# Patient Record
Sex: Female | Born: 1960 | Race: White | Hispanic: No | Marital: Married | Smoking: Never smoker
Health system: Southern US, Community
[De-identification: ages and names within clinical notes are randomized; demographics above are authoritative.]

## PROBLEM LIST (undated history)

## (undated) DIAGNOSIS — E785 Hyperlipidemia, unspecified: Secondary | ICD-10-CM

## (undated) DIAGNOSIS — I10 Essential (primary) hypertension: Secondary | ICD-10-CM

## (undated) HISTORY — DX: Essential (primary) hypertension: I10

## (undated) HISTORY — DX: Hyperlipidemia, unspecified: E78.5

## (undated) HISTORY — PX: OOPHORECTOMY: SHX86

---

## 2004-02-25 ENCOUNTER — Ambulatory Visit: Admission: RE | Admit: 2004-02-25 | Discharge: 2004-02-25 | Payer: Self-pay | Admitting: Obstetrics and Gynecology

## 2004-02-25 ENCOUNTER — Encounter (INDEPENDENT_AMBULATORY_CARE_PROVIDER_SITE_OTHER): Payer: Self-pay | Admitting: Specialist

## 2004-02-25 ENCOUNTER — Encounter (INDEPENDENT_AMBULATORY_CARE_PROVIDER_SITE_OTHER): Payer: Self-pay | Admitting: *Deleted

## 2005-12-09 ENCOUNTER — Ambulatory Visit: Payer: Self-pay | Admitting: Family Medicine

## 2006-03-14 ENCOUNTER — Ambulatory Visit: Payer: Self-pay | Admitting: Gastroenterology

## 2006-03-14 LAB — CONVERTED CEMR LAB
ALT: 17 units/L (ref 0–40)
AST: 18 units/L (ref 0–37)
Albumin: 4.2 g/dL (ref 3.5–5.2)
Alkaline Phosphatase: 65 units/L (ref 39–117)
BUN: 8 mg/dL (ref 6–23)
Basophils Absolute: 0 10*3/uL (ref 0.0–0.1)
Basophils Relative: 0 % (ref 0.0–1.0)
CO2: 30 meq/L (ref 19–32)
Calcium: 9.4 mg/dL (ref 8.4–10.5)
Chloride: 99 meq/L (ref 96–112)
Creatinine, Ser: 0.9 mg/dL (ref 0.4–1.2)
Eosinophil percent: 1 % (ref 0.0–5.0)
GFR calc non Af Amer: 71 mL/min
Glomerular Filtration Rate, Af Am: 86 mL/min/{1.73_m2}
Glucose, Bld: 91 mg/dL (ref 70–99)
HCT: 44.3 % (ref 36.0–46.0)
Hemoglobin: 14.7 g/dL (ref 12.0–15.0)
Lymphocytes Relative: 14.3 % (ref 12.0–46.0)
MCHC: 33.3 g/dL (ref 30.0–36.0)
MCV: 88.5 fL (ref 78.0–100.0)
Monocytes Absolute: 1.1 10*3/uL — ABNORMAL HIGH (ref 0.2–0.7)
Monocytes Relative: 9.9 % (ref 3.0–11.0)
Neutro Abs: 8.2 10*3/uL — ABNORMAL HIGH (ref 1.4–7.7)
Neutrophils Relative %: 74.8 % (ref 43.0–77.0)
Platelets: 251 10*3/uL (ref 150–400)
Potassium: 3.5 meq/L (ref 3.5–5.1)
RBC: 5.01 M/uL (ref 3.87–5.11)
RDW: 12.5 % (ref 11.5–14.6)
Sed Rate: 11 mm/hr (ref 0–25)
Sodium: 138 meq/L (ref 135–145)
TSH: 1.17 microintl units/mL (ref 0.35–5.50)
Total Bilirubin: 1 mg/dL (ref 0.3–1.2)
Total Protein: 7.4 g/dL (ref 6.0–8.3)
WBC: 11 10*3/uL — ABNORMAL HIGH (ref 4.5–10.5)

## 2006-03-15 ENCOUNTER — Encounter (INDEPENDENT_AMBULATORY_CARE_PROVIDER_SITE_OTHER): Payer: Self-pay | Admitting: *Deleted

## 2006-03-15 ENCOUNTER — Ambulatory Visit: Payer: Self-pay | Admitting: Gastroenterology

## 2006-04-18 ENCOUNTER — Ambulatory Visit: Payer: Self-pay | Admitting: Gastroenterology

## 2010-05-20 ENCOUNTER — Other Ambulatory Visit: Payer: Self-pay | Admitting: Dermatology

## 2010-07-20 ENCOUNTER — Other Ambulatory Visit: Payer: Self-pay | Admitting: Dermatology

## 2010-08-27 NOTE — Op Note (Signed)
Michelle Walter, Michelle Walter               ACCOUNT NO.:  [id number]   MEDICAL RECORD NO.:  [id number]          PATIENT TYPE:  INP   LOCATION:  0009                         FACILITY:  [facility]   PHYSICIAN:  Katherine Roan, M.D.  DATE OF BIRTH:  [birth year]   DATE OF PROCEDURE:  02/25/2004  DATE OF DISCHARGE:                                 OPERATIVE REPORT   PREOPERATIVE DIAGNOSIS:  Left adnexal mass.   POSTOPERATIVE DIAGNOSIS:  Endometriosis.   OPERATION PERFORMED:  Pelvic examination under anesthesia, laparoscopy with  operative laparoscopy and removal of left tube and ovary, fulguration of  endometriosis with YAG laser.   The patient was placed in the lithotomy position, examined under anesthesia,  and found to have a left adnexal mass, which was about 5 cm, as previously  described.  The right ovary was normal.  She was then prepped and draped in  the usual fashion.  Foley catheter was inserted.  A transverse umbilical  incision was made in the abdomen, and the Veress needle was inserted into  the abdomen.  Aspiration and infusion was used to be sure it was properly  placed, and then a bladed trocar was placed into the abdomen.  This was an  11 mm.  An 11 mm trocar was then placed into the midline along with a 5 mm  on the left.  The infundibulopelvic ligament was placed on stretch and was  cauterized using a Seitzinger tripolar forceps.  Hemostasis was good.  The  ovary and tube were removed in a segmental fashion using small bites in the  mesosalpinx and the utero-ovarian anastomosis and tube.  We then visualized  the cul de sac to contain numerous areas of endometriosis, which we  cauterized with the YAG laser.  I then put the cyst in an endo bag and  retrieved it through a midline incision, which was expanded.  This was then  closed with interrupted sutures of 0 Vicryl.  The skin was closed with 4-0  PDS subcuticular interrupted suture and an umbilical incision was closed  with a  similar suture, and the 5 mm trocar site on the left was then closed  with interrupted sutures of 4-0 PDS.  The incisions were infiltrated with  0.5% Marcaine with epinephrine.  The Foley was draining clear urine.  She  tolerated the procedure well and was sent to the recovery room in good  condition.      SDM/MEDQ  D:  02/25/2004  T:  02/25/2004  Job:  [id number]

## 2010-08-27 NOTE — Assessment & Plan Note (Signed)
[facility]                         GASTROENTEROLOGY OFFICE NOTE   AUDIANNA, Michelle Walter                      MRN:          [id number]  DATE:03/14/2006                            DOB:          05/24/1960    NEW PATIENT EVALUATION:   PRIMARY PHYSICIAN:  Marne A. Tower, MD   PROBLEM:  Lower abdominal discomfort and diarrhea.   HISTORY:  Michelle Walter is a pleasant, generally healthy 50 year old white  female.  She does have a history of hypertension and hyperlipidemia.  She is status post C-section x2 and had an oophorectomy unilaterally in  November 2005.   The patient has not had any previous GI problems and now relates that  she has been having a change in her bowel habits and abdominal cramping  since September 2007.  She was originally seen by Dr. Milinda Antis shortly  thereafter and had stool cultures done, which were negative.  She says,  however, that her symptoms have persisted and that she is having at  least 3-4 loose stools per day.  She said at one point she was having so  much diarrhea that the stool was actually clear.  She has not noted any  melena or hematochezia, has not been having any nocturnal diarrhea.  She  describes lower abdominal discomfort, urgency and cramping.  She has not  had any associated fever or chills, no myalgias or arthralgias.  Her  appetite has been okay.  Her weight has been stable and overall her  energy level has been good.  She denies any increased stress, has not  been on any antibiotics that she can recall, at least over the past 6  months.  No new medications, supplements, etc.  She does not use any  artificial sweeteners and has not changed her diet.   CURRENT MEDICATIONS:  1. Crestor one p.o. daily, 25 mg, she believes.  2. Hydrochlorothiazide 25 mg daily.   ALLERGIES:  No known drug allergies.   PAST HISTORY:  Hypertension, hyperlipidemia, C-section x2, and  unilateral oophorectomy in November 2005.   FAMILY HISTORY:  Negative for colon cancer, polyps or inflammatory bowel  disease.   SOCIAL HISTORY:  The patient is married, has 2 children.  She is  employed as a Midwife.  She is a nonsmoker, uses alcohol  socially.   REVIEW OF SYSTEMS:  Reviewed in its entirety, pertinent for GI as above,  and dysmenorrhea and back pain with her menstrual period.   PHYSICAL EXAMINATION:  GENERAL:  A well-developed white female in no  acute distress.  VITAL SIGNS:  Height is 5 feet 3 inches, weight is 158.  Blood pressure  is 122/82, pulse in the 80s.  HEENT:  Atraumatic, normocephalic.  EOMI.  PERRLA.  Sclerae are  anicteric.  CARDIOVASCULAR:  Regular rate and rhythm with S1 and S2, no murmur, rub,  or gallop.  PULMONARY:  Clear to A&P.  ABDOMEN:  Soft.  She is mildly tender in the left lower quadrant and  left midquadrant.  There is no guarding or rebound, no mass or palpable  hepatosplenomegaly.  Bowel sounds are active.  RECTAL:  Hemoccult-negative and without mass.   IMPRESSION:  A 50 year old female with a 3-1/2 month history of  persistent diarrhea and lower abdominal pain and cramping.  Rule out new  onset of colitis/inflammatory bowel disease.  Rule out possible  irritable bowel syndrome.  Rule out infectious, though less likely.   PLAN:  1. Check a CBC with differential, CMET, sedimentation rate, sprue      panel and TSH.  2. Schedule colonoscopy.  3. Trial of Robinul Forte 2 mg p.o. b.i.d. in the interim.      Mike Gip, PA-C  Electronically Signed      Michelle Rea. Jarold Motto, MD, Caleen Essex, FAGA  Electronically Signed   AE/MedQ  DD: 03/14/2006  DT: 03/15/2006  Job #: [id number]   cc:   Marne A. Milinda Antis, MD

## 2013-10-14 ENCOUNTER — Telehealth: Payer: Self-pay | Admitting: *Deleted

## 2013-10-14 NOTE — Telephone Encounter (Signed)
Just called and made an appointment on the 21st to see Dr. Ralene CorkSikora.  I just want to make sure I don't have a life threatening ailment.  The ball of my foot is numb, not numb where I can't feel anything.  I just want to make sure it's not a sign of Diabetes.  I returned her call and informed her she should be okay waiting until the 21st to see Dr. Ralene CorkSikora because it's a common problem.  She stated okay, I just want to make sure.

## 2013-10-29 ENCOUNTER — Ambulatory Visit: Payer: Self-pay

## 2013-11-12 ENCOUNTER — Ambulatory Visit (INDEPENDENT_AMBULATORY_CARE_PROVIDER_SITE_OTHER): Payer: BC Managed Care – PPO

## 2013-11-12 VITALS — BP 136/77 | HR 71 | Resp 14 | Ht 63.0 in | Wt 155.0 lb

## 2013-11-12 DIAGNOSIS — G5761 Lesion of plantar nerve, right lower limb: Secondary | ICD-10-CM

## 2013-11-12 DIAGNOSIS — R200 Anesthesia of skin: Secondary | ICD-10-CM

## 2013-11-12 DIAGNOSIS — M722 Plantar fascial fibromatosis: Secondary | ICD-10-CM

## 2013-11-12 DIAGNOSIS — R209 Unspecified disturbances of skin sensation: Secondary | ICD-10-CM

## 2013-11-12 DIAGNOSIS — G5760 Lesion of plantar nerve, unspecified lower limb: Secondary | ICD-10-CM

## 2013-11-12 NOTE — Patient Instructions (Signed)
Morton's Neuroma in Sports  (Interdigital Plantar Neuroma) Morton's neuroma is a condition of the nervous system that results in pain or loss of feeling in the toes. The disease is caused by the bones of the foot squeezing the nerve that runs between two toes (interdigital nerve). The third and fourth toes are most likely to be affected by this disease. SYMPTOMS   Tingling, numbness, burning, or electric shocks in the front of the foot, often involving the third and fourth toes, although it may involve any other pair of toes.  Pain and tenderness in the front of the foot, that gets worse when walking.  Pain that gets worse when pressure is applied to the foot (wearing shoes).  Severe pain in the front of the foot, when standing on the front of the foot (on tiptoes), such as with running, jumping, pivoting, or dancing. CAUSES  Morton's neuroma is caused by swelling of the nerve between two toes. This swelling causes the nerve to be pinched between the bones of the foot. RISK INCREASES WITH:  Recurring foot or ankle injuries.  Poor fitting or worn shoes, with minimal padding and shock absorbers.  Loose ligaments of the foot, causing thickening of the nerve.  Poor foot strength and flexibility. PREVENTION  Warm up and stretch properly before activity.  Maintain physical fitness:  Foot and ankle flexibility.  Muscle strength and endurance.  Cardiovascular fitness.  Wear properly fitted and padded shoes.  Wear arch supports (orthotics), when needed. PROGNOSIS  If treated properly, Morton's neuroma can usually be cured with non-surgical treatment. For certain cases, surgery may be needed. RELATED COMPLICATIONS  Permanent numbness and pain in the foot.  Inability to participate in athletics, because of pain. TREATMENT Treatment first involves stopping any activities that make the symptoms worse. The use of ice and medicine will help reduce pain and inflammation. Wearing shoes  with a wide toe box, and an orthotic arch support or metatarsal bar, may also reduce pain. Your caregiver may give you a corticosteroid injection, to further reduce inflammation. If non-surgical treatment is unsuccessful, surgery may be needed. Surgery to fix Morton's neuroma is often performed as an outpatient procedure, meaning you can go home the same day as the surgery. The procedure involves removing the source of pressure on the nerve. If it is necessary to remove the nerve, you can expect persistent numbness. MEDICATION  If pain medicine is needed, nonsteroidal anti-inflammatory medicines (aspirin and ibuprofen), or other minor pain relievers (acetaminophen), are often advised.  Do not take pain medicine for 7 days before surgery.  Prescription pain relievers are usually prescribed only after surgery. Use only as directed and only as much as you need.  Corticosteroid injections are used in extreme cases, to reduce inflammation. These injections should be done only if necessary, because they may be given only a limited number of times. HEAT AND COLD  Cold treatment (icing) should be applied for 10 to 15 minutes every 2 to 3 hours for inflammation and pain, and immediately after activity that aggravates your symptoms. Use ice packs or an ice massage.  Heat treatment may be used before performing stretching and strengthening activities prescribed by your caregiver, physical therapist, or athletic trainer. Use a heat pack or a warm water soak. SEEK MEDICAL CARE IF:   Symptoms get worse or do not improve in 2 weeks, despite treatment.  After surgery you develop increasing pain, swelling, redness, increased warmth, bleeding, drainage of fluids, or fever.  New, unexplained symptoms develop. (  Drugs used in treatment may produce side effects.) Document Released: 02/02/2005 Document Revised: 06/20/2011 Document Reviewed: 07/10/2008 [facility] Patient Information 2015 [employer]. This  information is not intended to replace advice given to you by your health care provider. Make sure you discuss any questions you have with your health care provider.   Recommendations for a pinched nerve to try to help restore some sensation of feeling is a B complex and folic acid, he should be available and a general multivitamin

## 2013-11-12 NOTE — Progress Notes (Signed)
   Subjective:    Patient ID: Michelle Walter, female    DOB: [birth year], 53 y.o.   MRN: [id number]  HPI Comments: N neuroma L right plantar forefoot and 2nd toe D Spring of 2015 O  C numbness A unknown stimuli T none      Review of Systems  Musculoskeletal: Positive for gait problem.       Stiffness in walking after sitting.  All other systems reviewed and are negative.      Objective:   Physical Exam 53 year old white female well-developed well-nourished oriented x3 presents this time with general numbness or abnormal sensation of her second toe right foot. Left foot is unremarkable is been going on for more than a year or so occasionally a tingle but no significant pain or other abnormality is a family history of diabetes although has not been diagnosed yourself. Currently ambulating in some flip-flops also points out that she has a small nodule medial band of the plantar fascia on her right foot. Next progress partially objective findings reveal neurovascular status is intact pedal pulses are palpable DP and PT +2/4 bilateral Refill time 3 seconds epicritic and proprioceptive sensations appear to be intact all areas although patient describes loss of feeling in her second toe and occasionally gets a tingling sensation or abnormal touch. Not completely numb but reduced sensation. 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 The bunions have been asymptomatic thus far.       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 Patient advised to avoid the  flip-flops when it and compressive on her foot did recommend vitamin B6 B12 and folic acid supplementation or a multivitamin. Wide accommodative shoes such as Physicist, medicalellegrino or [employer] her dance toe shoes no barefoot no flimsy shoes or flip-flops. If symptoms persist or worsen may be candidate for more invasive options also possibly surgical intervention for bunions if they become symptomatic in the future.  Alvan Dameichard Chrisanne Loose DPM

## 2014-04-29 ENCOUNTER — Other Ambulatory Visit: Payer: Self-pay | Admitting: Family Medicine

## 2014-04-29 ENCOUNTER — Ambulatory Visit
Admission: RE | Admit: 2014-04-29 | Discharge: 2014-04-29 | Disposition: A | Discharge status: Home / Self Care | Payer: BC Managed Care – PPO | Source: Ambulatory Visit | Attending: Family Medicine | Admitting: Family Medicine

## 2014-04-29 DIAGNOSIS — R05 Cough: Secondary | ICD-10-CM

## 2014-04-29 DIAGNOSIS — R059 Cough, unspecified: Secondary | ICD-10-CM

## 2014-08-06 ENCOUNTER — Other Ambulatory Visit: Payer: Self-pay | Admitting: Dermatology

## 2015-01-19 ENCOUNTER — Other Ambulatory Visit: Payer: Self-pay

## 2017-05-04 ENCOUNTER — Other Ambulatory Visit: Payer: Self-pay | Admitting: Obstetrics & Gynecology

## 2017-05-04 ENCOUNTER — Telehealth: Payer: Self-pay | Admitting: *Deleted

## 2017-05-04 DIAGNOSIS — Z1231 Encounter for screening mammogram for malignant neoplasm of breast: Secondary | ICD-10-CM

## 2017-05-04 NOTE — Telephone Encounter (Signed)
[facility] asked name [facility] facility, breast center # given to patient.

## 2017-05-24 ENCOUNTER — Ambulatory Visit
Admission: RE | Admit: 2017-05-24 | Discharge: 2017-05-24 | Disposition: A | Discharge status: Home / Self Care | Payer: BC Managed Care – PPO | Source: Ambulatory Visit | Attending: Obstetrics & Gynecology | Admitting: Obstetrics & Gynecology

## 2017-05-24 ENCOUNTER — Encounter: Payer: Self-pay | Admitting: Radiology

## 2017-05-24 DIAGNOSIS — Z1231 Encounter for screening mammogram for malignant neoplasm of breast: Secondary | ICD-10-CM

## 2017-05-30 ENCOUNTER — Encounter: Payer: BC Managed Care – PPO | Admitting: Obstetrics & Gynecology

## 2017-06-08 ENCOUNTER — Encounter: Payer: Self-pay | Admitting: Obstetrics & Gynecology

## 2017-06-08 ENCOUNTER — Ambulatory Visit: Payer: BC Managed Care – PPO | Admitting: Obstetrics & Gynecology

## 2017-06-08 VITALS — BP 140/90 | Ht 62.75 in | Wt 142.0 lb

## 2017-06-08 DIAGNOSIS — Z780 Asymptomatic menopausal state: Secondary | ICD-10-CM

## 2017-06-08 DIAGNOSIS — Z01419 Encounter for gynecological examination (general) (routine) without abnormal findings: Secondary | ICD-10-CM | POA: Diagnosis not present

## 2017-06-08 DIAGNOSIS — Z13820 Encounter for screening for osteoporosis: Secondary | ICD-10-CM

## 2017-06-08 NOTE — Patient Instructions (Signed)
1. Well female exam with routine gynecological exam Normal gynecologic exam status post LSO.  Pap negative February 2018.  Will repeat Pap test next year.  Breast exam normal.  Mammogram negative February 2019.  Will organize next colonoscopy with Dr. Collene Mares.  Health labs with family physician.  Recommend regular aerobic and weightlifting physical activity.  2. Menopause present Well on no hormone replacement therapy.  No postmenopausal bleeding.  3. Screening for osteoporosis Vitamin D supplements, calcium rich nutrition and regular weightbearing physical activity recommended.  Will schedule bone density here now. - DG Bone Density; Future  Michelle Walter, it was a pleasure seeing you today!   Health Maintenance for Postmenopausal Women Menopause is a normal process in which your reproductive ability comes to an end. This process happens gradually over a span of months to years, usually between the ages of 62 and 33. Menopause is complete when you have missed 12 consecutive menstrual periods. It is important to talk with your health care provider about some of the most common conditions that affect postmenopausal women, such as heart disease, cancer, and bone loss (osteoporosis). Adopting a healthy lifestyle and getting preventive care can help to promote your health and wellness. Those actions can also lower your chances of developing some of these common conditions. What should I know about menopause? During menopause, you may experience a number of symptoms, such as:  Moderate-to-severe hot flashes.  Night sweats.  Decrease in sex drive.  Mood swings.  Headaches.  Tiredness.  Irritability.  Memory problems.  Insomnia.  Choosing to treat or not to treat menopausal changes is an individual decision that you make with your health care provider. What should I know about hormone replacement therapy and supplements? Hormone therapy products are effective for treating symptoms that are  associated with menopause, such as hot flashes and night sweats. Hormone replacement carries certain risks, especially as you become older. If you are thinking about using estrogen or estrogen with progestin treatments, discuss the benefits and risks with your health care provider. What should I know about heart disease and stroke? Heart disease, heart attack, and stroke become more likely as you age. This may be due, in part, to the hormonal changes that your body experiences during menopause. These can affect how your body processes dietary fats, triglycerides, and cholesterol. Heart attack and stroke are both medical emergencies. There are many things that you can do to help prevent heart disease and stroke:  Have your blood pressure checked at least every 1-2 years. High blood pressure causes heart disease and increases the risk of stroke.  If you are 36-12 years old, ask your health care provider if you should take aspirin to prevent a heart attack or a stroke.  Do not use any tobacco products, including cigarettes, chewing tobacco, or electronic cigarettes. If you need help quitting, ask your health care provider.  It is important to eat a healthy diet and maintain a healthy weight. ? Be sure to include plenty of vegetables, fruits, low-fat dairy products, and lean protein. ? Avoid eating foods that are high in solid fats, added sugars, or salt (sodium).  Get regular exercise. This is one of the most important things that you can do for your health. ? Try to exercise for at least 150 minutes each week. The type of exercise that you do should increase your heart rate and make you sweat. This is known as moderate-intensity exercise. ? Try to do strengthening exercises at least twice each week. Do  these in addition to the moderate-intensity exercise.  Know your numbers.Ask your health care provider to check your cholesterol and your blood glucose. Continue to have your blood tested as directed  by your health care provider.  What should I know about cancer screening? There are several types of cancer. Take the following steps to reduce your risk and to catch any cancer development as early as possible. Breast Cancer  Practice breast self-awareness. ? This means understanding how your breasts normally appear and feel. ? It also means doing regular breast self-exams. Let your health care provider know about any changes, no matter how small.  If you are 64 or older, have a clinician do a breast exam (clinical breast exam or CBE) every year. Depending on your age, family history, and medical history, it may be recommended that you also have a yearly breast X-ray (mammogram).  If you have a family history of breast cancer, talk with your health care provider about genetic screening.  If you are at high risk for breast cancer, talk with your health care provider about having an MRI and a mammogram every year.  Breast cancer (BRCA) gene test is recommended for women who have family members with BRCA-related cancers. Results of the assessment will determine the need for genetic counseling and BRCA1 and for BRCA2 testing. BRCA-related cancers include these types: ? Breast. This occurs in males or females. ? Ovarian. ? Tubal. This may also be called fallopian tube cancer. ? Cancer of the abdominal or pelvic lining (peritoneal cancer). ? Prostate. ? Pancreatic.  Cervical, Uterine, and Ovarian Cancer Your health care provider may recommend that you be screened regularly for cancer of the pelvic organs. These include your ovaries, uterus, and vagina. This screening involves a pelvic exam, which includes checking for microscopic changes to the surface of your cervix (Pap test).  For women ages 21-65, health care providers may recommend a pelvic exam and a Pap test every three years. For women ages 74-65, they may recommend the Pap test and pelvic exam, combined with testing for human papilloma  virus (HPV), every five years. Some types of HPV increase your risk of cervical cancer. Testing for HPV may also be done on women of any age who have unclear Pap test results.  Other health care providers may not recommend any screening for nonpregnant women who are considered low risk for pelvic cancer and have no symptoms. Ask your health care provider if a screening pelvic exam is right for you.  If you have had past treatment for cervical cancer or a condition that could lead to cancer, you need Pap tests and screening for cancer for at least 20 years after your treatment. If Pap tests have been discontinued for you, your risk factors (such as having a new sexual partner) need to be reassessed to determine if you should start having screenings again. Some women have medical problems that increase the chance of getting cervical cancer. In these cases, your health care provider may recommend that you have screening and Pap tests more often.  If you have a family history of uterine cancer or ovarian cancer, talk with your health care provider about genetic screening.  If you have vaginal bleeding after reaching menopause, tell your health care provider.  There are currently no reliable tests available to screen for ovarian cancer.  Lung Cancer Lung cancer screening is recommended for adults 33-87 years old who are at high risk for lung cancer because of a history of  smoking. A yearly low-dose CT scan of the lungs is recommended if you:  Currently smoke.  Have a history of at least 30 pack-years of smoking and you currently smoke or have quit within the past 15 years. A pack-year is smoking an average of one pack of cigarettes per day for one year.  Yearly screening should:  Continue until it has been 15 years since you quit.  Stop if you develop a health problem that would prevent you from having lung cancer treatment.  Colorectal Cancer  This type of cancer can be detected and can often  be prevented.  Routine colorectal cancer screening usually begins at age 57 and continues through age 87.  If you have risk factors for colon cancer, your health care provider may recommend that you be screened at an earlier age.  If you have a family history of colorectal cancer, talk with your health care provider about genetic screening.  Your health care provider may also recommend using home test kits to check for hidden blood in your stool.  A small camera at the end of a tube can be used to examine your colon directly (sigmoidoscopy or colonoscopy). This is done to check for the earliest forms of colorectal cancer.  Direct examination of the colon should be repeated every 5-10 years until age 52. However, if early forms of precancerous polyps or small growths are found or if you have a family history or genetic risk for colorectal cancer, you may need to be screened more often.  Skin Cancer  Check your skin from head to toe regularly.  Monitor any moles. Be sure to tell your health care provider: ? About any new moles or changes in moles, especially if there is a change in a mole's shape or color. ? If you have a mole that is larger than the size of a pencil eraser.  If any of your family members has a history of skin cancer, especially at a young age, talk with your health care provider about genetic screening.  Always use sunscreen. Apply sunscreen liberally and repeatedly throughout the day.  Whenever you are outside, protect yourself by wearing long sleeves, pants, a wide-brimmed hat, and sunglasses.  What should I know about osteoporosis? Osteoporosis is a condition in which bone destruction happens more quickly than new bone creation. After menopause, you may be at an increased risk for osteoporosis. To help prevent osteoporosis or the bone fractures that can happen because of osteoporosis, the following is recommended:  If you are 15-3 years old, get at least 1,000 mg of  calcium and at least 600 mg of vitamin D per day.  If you are older than age 30 but younger than age 28, get at least 1,200 mg of calcium and at least 600 mg of vitamin D per day.  If you are older than age 57, get at least 1,200 mg of calcium and at least 800 mg of vitamin D per day.  Smoking and excessive alcohol intake increase the risk of osteoporosis. Eat foods that are rich in calcium and vitamin D, and do weight-bearing exercises several times each week as directed by your health care provider. What should I know about how menopause affects my mental health? Depression may occur at any age, but it is more common as you become older. Common symptoms of depression include:  Low or sad mood.  Changes in sleep patterns.  Changes in appetite or eating patterns.  Feeling an overall lack of  motivation or enjoyment of activities that you previously enjoyed.  Frequent crying spells.  Talk with your health care provider if you think that you are experiencing depression. What should I know about immunizations? It is important that you get and maintain your immunizations. These include:  Tetanus, diphtheria, and pertussis (Tdap) booster vaccine.  Influenza every year before the flu season begins.  Pneumonia vaccine.  Shingles vaccine.  Your health care provider may also recommend other immunizations. This information is not intended to replace advice given to you by your health care provider. Make sure you discuss any questions you have with your health care provider. Document Released: 05/20/2005 Document Revised: 10/16/2015 Document Reviewed: 12/30/2014 Elsevier Interactive Patient Education  2018 [employer].

## 2017-06-08 NOTE — Progress Notes (Signed)
Michelle FinnerCarol T Walter [birth year] [id number]   History:    57 y.o. G2P2L2  Married.  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  59-year-old granddaughter.  RP:  Established patient presenting for annual gyn exam   HPI: Status post LSO.  Menopause.  Well on no hormone replacement therapy.  No postmenopausal bleeding.  No pelvic pain.  Uses [place] for intercourse.  No postcoital bleeding and no pain.  Urine and bowel movements normal.  Breasts normal.  Body mass index 25.36.  Health labs with family physician.  Past medical history,surgical history, family history and social history were all reviewed and documented in the EPIC chart.  Gynecologic History Patient's last menstrual period was 09/22/2013. Contraception: post menopausal status Last Pap: 05/2016. Results were: Negative Last mammogram: 05/2017. Results were: Negative Bone Density: Never Colonoscopy: 2013 with Dr Loreta AveMann  Obstetric History OB History  Gravida Para Term Preterm AB Living  2 2       2   SAB TAB Ectopic Multiple Live Births               # Outcome Date GA Lbr Len/2nd Weight Sex Delivery Anes PTL Lv  2 Para           1 Para                ROS: A ROS was performed and pertinent positives and negatives are included in the history.  GENERAL: No fevers or chills. HEENT: No change in vision, no earache, sore throat or sinus congestion. NECK: No pain or stiffness. CARDIOVASCULAR: No chest pain or pressure. No palpitations. PULMONARY: No shortness of breath, cough or wheeze. GASTROINTESTINAL: No abdominal pain, nausea, vomiting or diarrhea, melena or bright red blood per rectum. GENITOURINARY: No urinary frequency, urgency, hesitancy or dysuria. MUSCULOSKELETAL: No joint or muscle pain, no back pain, no recent trauma. DERMATOLOGIC: No rash, no itching, no lesions. ENDOCRINE: No polyuria, polydipsia, no heat or cold intolerance. No recent change in weight. HEMATOLOGICAL: No anemia or easy bruising or bleeding. NEUROLOGIC: No headache, seizures,  numbness, tingling or weakness. PSYCHIATRIC: No depression, no loss of interest in normal activity or change in sleep pattern.     Exam:   BP 140/90   Ht 5' 2.75" (1.594 m)   Wt 142 lb (64.4 kg)   LMP 09/22/2013   BMI 25.36 kg/m   Body mass index is 25.36 kg/m.  General appearance : Well developed well nourished female. No acute distress HEENT: Eyes: no retinal hemorrhage or exudates,  Neck supple, trachea midline, no carotid bruits, no thyroidmegaly Lungs: Clear to auscultation, no rhonchi or wheezes, or rib retractions  Heart: Regular rate and rhythm, no murmurs or gallops Breast:Examined in sitting and supine position were symmetrical in appearance, no palpable masses or tenderness,  no skin retraction, no nipple inversion, no nipple discharge, no skin discoloration, no axillary or supraclavicular lymphadenopathy Abdomen: no palpable masses or tenderness, no rebound or guarding Extremities: no edema or skin discoloration or tenderness  Pelvic: Vulva: Normal             Vagina: No gross lesions or discharge  Cervix: No gross lesions or discharge  Uterus  AV, normal size, shape and consistency, non-tender and mobile  Adnexa  Without masses or tenderness  Anus: Normal   Assessment/Plan:  57 y.o. female for annual exam   1. Well female exam with routine gynecological exam Normal gynecologic exam status post LSO.  Pap negative February 2018.  Will repeat Pap test next year.  Breast exam normal.  Mammogram negative February 2019.  Will organize next colonoscopy with Dr. Loreta Ave.  Health labs with family physician.  Recommend regular aerobic and weightlifting physical activity.  2. Menopause present Well on no hormone replacement therapy.  No postmenopausal bleeding.  3. Screening for osteoporosis Vitamin D supplements, calcium rich nutrition and regular weightbearing physical activity recommended.  Will schedule bone density here now. - DG Bone Density; Future  Genia Del  MD, 9:54 AM 06/08/2017

## 2017-06-14 ENCOUNTER — Other Ambulatory Visit: Payer: Self-pay | Admitting: Gynecology

## 2017-06-14 DIAGNOSIS — Z13820 Encounter for screening for osteoporosis: Secondary | ICD-10-CM

## 2017-06-16 ENCOUNTER — Other Ambulatory Visit: Payer: Self-pay | Admitting: Family Medicine

## 2017-06-16 DIAGNOSIS — R0989 Other specified symptoms and signs involving the circulatory and respiratory systems: Secondary | ICD-10-CM

## 2017-06-20 ENCOUNTER — Ambulatory Visit
Admission: RE | Admit: 2017-06-20 | Discharge: 2017-06-20 | Disposition: A | Discharge status: Home / Self Care | Payer: BC Managed Care – PPO | Source: Ambulatory Visit | Attending: Family Medicine | Admitting: Family Medicine

## 2017-06-20 DIAGNOSIS — R0989 Other specified symptoms and signs involving the circulatory and respiratory systems: Secondary | ICD-10-CM

## 2017-06-22 ENCOUNTER — Encounter: Payer: Self-pay | Admitting: Gynecology

## 2017-06-22 ENCOUNTER — Ambulatory Visit (INDEPENDENT_AMBULATORY_CARE_PROVIDER_SITE_OTHER): Payer: BC Managed Care – PPO

## 2017-06-22 DIAGNOSIS — Z13820 Encounter for screening for osteoporosis: Secondary | ICD-10-CM

## 2017-06-27 ENCOUNTER — Other Ambulatory Visit: Payer: Self-pay | Admitting: Family Medicine

## 2017-06-27 DIAGNOSIS — E041 Nontoxic single thyroid nodule: Secondary | ICD-10-CM

## 2017-06-30 ENCOUNTER — Ambulatory Visit
Admission: RE | Admit: 2017-06-30 | Discharge: 2017-06-30 | Disposition: A | Discharge status: Home / Self Care | Payer: BC Managed Care – PPO | Source: Ambulatory Visit | Attending: Family Medicine | Admitting: Family Medicine

## 2017-06-30 DIAGNOSIS — E041 Nontoxic single thyroid nodule: Secondary | ICD-10-CM

## 2017-07-07 ENCOUNTER — Other Ambulatory Visit: Payer: Self-pay | Admitting: Family Medicine

## 2017-07-07 DIAGNOSIS — E041 Nontoxic single thyroid nodule: Secondary | ICD-10-CM

## 2017-08-02 ENCOUNTER — Other Ambulatory Visit (HOSPITAL_COMMUNITY)
Admission: RE | Admit: 2017-08-02 | Discharge: 2017-08-02 | Disposition: A | Discharge status: Home / Self Care | Payer: BC Managed Care – PPO | Source: Ambulatory Visit | Attending: Physician Assistant | Admitting: Physician Assistant

## 2017-08-02 ENCOUNTER — Ambulatory Visit
Admission: RE | Admit: 2017-08-02 | Discharge: 2017-08-02 | Disposition: A | Discharge status: Home / Self Care | Payer: BC Managed Care – PPO | Source: Ambulatory Visit | Attending: Family Medicine | Admitting: Family Medicine

## 2017-08-02 DIAGNOSIS — E041 Nontoxic single thyroid nodule: Secondary | ICD-10-CM | POA: Diagnosis present

## 2018-04-26 ENCOUNTER — Other Ambulatory Visit: Payer: Self-pay | Admitting: Obstetrics & Gynecology

## 2018-04-26 DIAGNOSIS — Z1231 Encounter for screening mammogram for malignant neoplasm of breast: Secondary | ICD-10-CM

## 2018-05-31 ENCOUNTER — Ambulatory Visit
Admission: RE | Admit: 2018-05-31 | Discharge: 2018-05-31 | Disposition: A | Discharge status: Home / Self Care | Payer: BC Managed Care – PPO | Source: Ambulatory Visit | Attending: Obstetrics & Gynecology | Admitting: Obstetrics & Gynecology

## 2018-05-31 DIAGNOSIS — Z1231 Encounter for screening mammogram for malignant neoplasm of breast: Secondary | ICD-10-CM

## 2018-06-14 ENCOUNTER — Encounter: Payer: BC Managed Care – PPO | Admitting: Obstetrics & Gynecology

## 2018-08-13 ENCOUNTER — Other Ambulatory Visit: Payer: Self-pay

## 2018-08-14 ENCOUNTER — Ambulatory Visit: Payer: BC Managed Care – PPO | Admitting: Obstetrics & Gynecology

## 2018-08-14 ENCOUNTER — Encounter: Payer: Self-pay | Admitting: Obstetrics & Gynecology

## 2018-08-14 VITALS — BP 136/90 | Ht 61.5 in | Wt 154.4 lb

## 2018-08-14 DIAGNOSIS — Z01419 Encounter for gynecological examination (general) (routine) without abnormal findings: Secondary | ICD-10-CM | POA: Diagnosis not present

## 2018-08-14 DIAGNOSIS — Z780 Asymptomatic menopausal state: Secondary | ICD-10-CM | POA: Diagnosis not present

## 2018-08-14 DIAGNOSIS — E663 Overweight: Secondary | ICD-10-CM

## 2018-08-14 DIAGNOSIS — Z87410 Personal history of cervical dysplasia: Secondary | ICD-10-CM | POA: Diagnosis not present

## 2018-08-14 NOTE — Progress Notes (Signed)
Michelle Walter September 16, 1960 [id number]   History:    58 y.o. G2P2L2 Married  RP:  Established patient presenting for annual gyn exam   HPI: Menopause, well on no hormone replacement therapy.  No postmenopausal bleeding.  No pelvic pain.  No pain with intercourse.  Normal vaginal secretions.  Urine and bowel movements normal.  Breasts normal.  Body mass index 28.7.  Health labs with family physician.  Colonoscopy in 2019.  Past medical history,surgical history, family history and social history were all reviewed and documented in the EPIC chart.  Gynecologic History Patient's last menstrual period was 09/22/2013. Contraception: post menopausal status Last Pap: 2018. Results were: normal per patient Last mammogram: February 2020. Results were: Negative Bone Density: March 2019 normal, repeat at 5 years. Colonoscopy: 2019  Obstetric History OB History  Gravida Para Term Preterm AB Living  2 2       2   SAB TAB Ectopic Multiple Live Births               # Outcome Date GA Lbr Len/2nd Weight Sex Delivery Anes PTL Lv  2 Para           1 Para              ROS: A ROS was performed and pertinent positives and negatives are included in the history.  GENERAL: No fevers or chills. HEENT: No change in vision, no earache, sore throat or sinus congestion. NECK: No pain or stiffness. CARDIOVASCULAR: No chest pain or pressure. No palpitations. PULMONARY: No shortness of breath, cough or wheeze. GASTROINTESTINAL: No abdominal pain, nausea, vomiting or diarrhea, melena or bright red blood per rectum. GENITOURINARY: No urinary frequency, urgency, hesitancy or dysuria. MUSCULOSKELETAL: No joint or muscle pain, no back pain, no recent trauma. DERMATOLOGIC: No rash, no itching, no lesions. ENDOCRINE: No polyuria, polydipsia, no heat or cold intolerance. No recent change in weight. HEMATOLOGICAL: No anemia or easy bruising or bleeding. NEUROLOGIC: No headache, seizures, numbness, tingling or weakness.  PSYCHIATRIC: No depression, no loss of interest in normal activity or change in sleep pattern.     Exam:   BP 136/90   Ht 5' 1.5" (1.562 m)   Wt 154 lb 6.4 oz (70 kg)   LMP 09/22/2013   BMI 28.70 kg/m   Body mass index is 28.7 kg/m.  General appearance : Well developed well nourished female. No acute distress HEENT: Eyes: no retinal hemorrhage or exudates,  Neck supple, trachea midline, no carotid bruits, no thyroidmegaly Lungs: Clear to auscultation, no rhonchi or wheezes, or rib retractions  Heart: Regular rate and rhythm, no murmurs or gallops Breast:Examined in sitting and supine position were symmetrical in appearance, no palpable masses or tenderness,  no skin retraction, no nipple inversion, no nipple discharge, no skin discoloration, no axillary or supraclavicular lymphadenopathy Abdomen: no palpable masses or tenderness, no rebound or guarding Extremities: no edema or skin discoloration or tenderness  Pelvic: Vulva: Normal             Vagina: No gross lesions or discharge  Cervix: No gross lesions or discharge.  Pap reflex done.  Uterus AV, normal size, shape and consistency, non-tender and mobile  Adnexa  Without masses or tenderness  Anus: Normal   Assessment/Plan:  58 y.o. female for annual exam   1. Encounter for routine gynecological examination with Papanicolaou smear of cervix Normal gynecologic exam in menopause.  Pap reflex done.  Breast exam normal.  Last screening mammogram February 2020  was negative.  Colonoscopy done in 2019.  Health labs with family physician.  2. Postmenopause Well on no hormone replacement therapy.  No postmenopausal bleeding.  Bone density in March 2019 was completely normal, will repeat at 5 years.  Recommend vitamin D supplements, calcium intake of 1200 to 1500 mg daily and regular weightbearing physical activities.  3. Personal history of cervical dysplasia Pap reflex done today.  4. Overweight (BMI 25.0-29.9) Recommend a lower  calorie/carb diet such as [employer] diet and regular physical activities with aerobic activities 5 times a week and weightlifting every 2 days.  Other orders - polyethylene glycol (MIRALAX / GLYCOLAX) 17 g packet; Take 17 g by mouth daily. - losartan-hydrochlorothiazide (HYZAAR) 100-25 MG tablet; Take 1 tablet by mouth daily. - meloxicam (MOBIC) 7.5 MG tablet; Take 7.5 mg by mouth daily. - omeprazole (PRILOSEC) 20 MG capsule; Take 20 mg by mouth daily. - ALPRAZolam (XANAX) 0.25 MG tablet; Take 0.25 mg by mouth 2 (two) times daily as needed for anxiety.  Michelle DelMarie-Lyne Denetra Formoso MD, 2:09 PM 08/14/2018

## 2018-08-16 LAB — HUMAN PAPILLOMAVIRUS, HIGH RISK: HPV DNA High Risk: NOT DETECTED

## 2018-08-16 LAB — PAP IG W/ RFLX HPV ASCU

## 2018-08-17 ENCOUNTER — Encounter: Payer: BC Managed Care – PPO | Admitting: Obstetrics & Gynecology

## 2018-08-17 ENCOUNTER — Encounter: Payer: Self-pay | Admitting: Obstetrics & Gynecology

## 2018-08-17 NOTE — Patient Instructions (Signed)
1. Encounter for routine gynecological examination with Papanicolaou smear of cervix Normal gynecologic exam in menopause.  Pap reflex done.  Breast exam normal.  Last screening mammogram February 2020 was negative.  Colonoscopy done in 2019.  Health labs with family physician.  2. Postmenopause Well on no hormone replacement therapy.  No postmenopausal bleeding.  Bone density in March 2019 was completely normal, will repeat at 5 years.  Recommend vitamin D supplements, calcium intake of 1200 to 1500 mg daily and regular weightbearing physical activities.  3. Personal history of cervical dysplasia Pap reflex done today.  4. Overweight (BMI 25.0-29.9) Recommend a lower calorie/carb diet such as [employer] and regular physical activities with aerobic activities 5 times a week and weightlifting every 2 days.  Other orders - polyethylene glycol (MIRALAX / GLYCOLAX) 17 g packet; Take 17 g by mouth daily. - losartan-hydrochlorothiazide (HYZAAR) 100-25 MG tablet; Take 1 tablet by mouth daily. - meloxicam (MOBIC) 7.5 MG tablet; Take 7.5 mg by mouth daily. - omeprazole (PRILOSEC) 20 MG capsule; Take 20 mg by mouth daily. - ALPRAZolam (XANAX) 0.25 MG tablet; Take 0.25 mg by mouth 2 (two) times daily as needed for anxiety.  Michelle Walter, it was a pleasure seeing you today!  I will inform you of your results as soon as they are available.

## 2018-09-18 ENCOUNTER — Other Ambulatory Visit: Payer: Self-pay | Admitting: Family Medicine

## 2018-09-18 DIAGNOSIS — E041 Nontoxic single thyroid nodule: Secondary | ICD-10-CM

## 2018-09-28 ENCOUNTER — Ambulatory Visit
Admission: RE | Admit: 2018-09-28 | Discharge: 2018-09-28 | Disposition: A | Discharge status: Home / Self Care | Payer: BC Managed Care – PPO | Source: Ambulatory Visit | Attending: Family Medicine | Admitting: Family Medicine

## 2018-09-28 DIAGNOSIS — E041 Nontoxic single thyroid nodule: Secondary | ICD-10-CM

## 2019-01-08 ENCOUNTER — Encounter: Payer: Self-pay | Admitting: Gynecology

## 2019-01-11 ENCOUNTER — Other Ambulatory Visit: Payer: Self-pay

## 2019-01-11 DIAGNOSIS — Z20822 Contact with and (suspected) exposure to covid-19: Secondary | ICD-10-CM

## 2019-01-12 LAB — NOVEL CORONAVIRUS, NAA: SARS-CoV-2, NAA: NOT DETECTED

## 2019-01-17 ENCOUNTER — Other Ambulatory Visit: Payer: Self-pay

## 2019-01-17 DIAGNOSIS — Z20822 Contact with and (suspected) exposure to covid-19: Secondary | ICD-10-CM

## 2019-01-18 LAB — NOVEL CORONAVIRUS, NAA: SARS-CoV-2, NAA: NOT DETECTED

## 2019-04-30 ENCOUNTER — Ambulatory Visit: Payer: BC Managed Care – PPO | Admitting: Obstetrics & Gynecology

## 2019-04-30 ENCOUNTER — Other Ambulatory Visit: Payer: Self-pay | Admitting: Obstetrics & Gynecology

## 2019-04-30 ENCOUNTER — Encounter: Payer: Self-pay | Admitting: Obstetrics & Gynecology

## 2019-04-30 ENCOUNTER — Other Ambulatory Visit: Payer: Self-pay

## 2019-04-30 VITALS — BP 128/82

## 2019-04-30 DIAGNOSIS — Z1231 Encounter for screening mammogram for malignant neoplasm of breast: Secondary | ICD-10-CM

## 2019-04-30 DIAGNOSIS — N3001 Acute cystitis with hematuria: Secondary | ICD-10-CM

## 2019-04-30 DIAGNOSIS — R3915 Urgency of urination: Secondary | ICD-10-CM | POA: Diagnosis not present

## 2019-04-30 MED ORDER — FLUCONAZOLE 150 MG PO TABS: 150.0000 mg | ORAL_TABLET | Freq: Every day | ORAL | 1 refills | Status: AC

## 2019-04-30 MED ORDER — SULFAMETHOXAZOLE-TRIMETHOPRIM 800-160 MG PO TABS: 1.0000 | ORAL_TABLET | Freq: Two times a day (BID) | ORAL | 0 refills | Status: AC

## 2019-04-30 NOTE — Progress Notes (Signed)
    Michelle Walter 1961-03-11 [id number]        59 y.o.  G2P2L2 Married  RP: Burning with urination post IC  HPI: Burning with urination and frequency post intercourse.  No blood seen in urine.  No fever.  No pelvic pain and no postmenopausal bleeding.   OB History  Gravida Para Term Preterm AB Living  2 2       2   SAB TAB Ectopic Multiple Live Births               # Outcome Date GA Lbr Len/2nd Weight Sex Delivery Anes PTL Lv  2 Para           1 Para             Past medical history,surgical history, problem list, medications, allergies, family history and social history were all reviewed and documented in the EPIC chart.   Directed ROS with pertinent positives and negatives documented in the history of present illness/assessment and plan.  Exam:  Vitals:   04/30/19 1127  BP: 128/82   General appearance:  Normal  CVAT Negative bilaterally  Abdomen: Normal  Gynecologic exam: Deferred  U/A: Yellow cloudy, protein trace, nitrites negative, white blood cells more than 60, red blood cells 10-20, bacteria many.  Urine culture pending.   Assessment/Plan:  59 y.o. G2P2   1. Urgency of urination Probable acute cystitis per symptoms and urine analysis findings.  Recommend drinking plenty of water.  Will treat with Bactrim DS 1 tablet twice a day for 3 days.  Pending urine culture. - Urinalysis,Complete w/RFL Culture  2. Acute cystitis with hematuria As above.  Other orders - sulfamethoxazole-trimethoprim (BACTRIM DS) 800-160 MG tablet; Take 1 tablet by mouth 2 (two) times daily for 3 days. - fluconazole (DIFLUCAN) 150 MG tablet; Take 1 tablet (150 mg total) by mouth daily for 3 days.  67 MD, 11:36 AM 04/30/2019

## 2019-05-03 LAB — URINALYSIS, COMPLETE W/RFL CULTURE
Bilirubin Urine: NEGATIVE
Glucose, UA: NEGATIVE
Hyaline Cast: NONE SEEN /LPF
Ketones, ur: NEGATIVE
Nitrites, Initial: NEGATIVE
Specific Gravity, Urine: 1.02 (ref 1.001–1.03)
WBC, UA: >=60 /HPF — AB (ref 0–5)
pH: 6.5 (ref 5.0–8.0)

## 2019-05-03 LAB — URINE CULTURE
MICRO NUMBER:: 10056471
SPECIMEN QUALITY:: ADEQUATE

## 2019-05-03 LAB — CULTURE INDICATED

## 2019-05-05 ENCOUNTER — Encounter: Payer: Self-pay | Admitting: Obstetrics & Gynecology

## 2019-05-05 NOTE — Patient Instructions (Signed)
1. Urgency of urination Probable acute cystitis per symptoms and urine analysis findings.  Recommend drinking plenty of water.  Will treat with Bactrim DS 1 tablet twice a day for 3 days.  Pending urine culture. - Urinalysis,Complete w/RFL Culture  2. Acute cystitis with hematuria As above.  Other orders - sulfamethoxazole-trimethoprim (BACTRIM DS) 800-160 MG tablet; Take 1 tablet by mouth 2 (two) times daily for 3 days. - fluconazole (DIFLUCAN) 150 MG tablet; Take 1 tablet (150 mg total) by mouth daily for 3 days.  Michelle Walter, it was a pleasure seeing you today!  I will inform you of your results as soon as they are available.

## 2019-05-10 ENCOUNTER — Other Ambulatory Visit: Payer: Self-pay

## 2019-05-10 ENCOUNTER — Ambulatory Visit: Payer: BC Managed Care – PPO | Admitting: Obstetrics & Gynecology

## 2019-05-10 ENCOUNTER — Encounter: Payer: Self-pay | Admitting: Obstetrics & Gynecology

## 2019-05-10 VITALS — BP 124/80

## 2019-05-10 DIAGNOSIS — R102 Pelvic and perineal pain: Secondary | ICD-10-CM | POA: Diagnosis not present

## 2019-05-10 DIAGNOSIS — R300 Dysuria: Secondary | ICD-10-CM

## 2019-05-10 MED ORDER — FLUCONAZOLE 150 MG PO TABS: 150.0000 mg | ORAL_TABLET | Freq: Every day | ORAL | 2 refills | Status: AC

## 2019-05-10 MED ORDER — CIPROFLOXACIN HCL 500 MG PO TABS: 500.0000 mg | ORAL_TABLET | Freq: Two times a day (BID) | ORAL | 0 refills | Status: AC

## 2019-05-10 NOTE — Patient Instructions (Signed)
1. Dysuria Probable persistence of acute cystitis post Bactrim treatment April 30, 2019.  Urine culture showed E. coli sensitive to Bactrim but with a sensitivity more or equal to 20.  Improvement of symptoms at that time but now recurrence rapidly after.  Urine analysis today is not as perturbed but still not normal.  Decision to treat with ciprofloxacin which had a sensitivity more or equal to 0.25 on urine culture positive for E. coli at last visit.  Usage reviewed with patient.  Prescription sent to pharmacy.  Will cover with fluconazole as needed again.  Will adjust management per urine culture results and response to Cipro. - Urinalysis,Complete w/RFL Culture  2. Suprapubic pain As above.  Other orders - ciprofloxacin (CIPRO) 500 MG tablet; Take 1 tablet (500 mg total) by mouth 2 (two) times daily for 7 days. - fluconazole (DIFLUCAN) 150 MG tablet; Take 1 tablet (150 mg total) by mouth daily for 3 days.  Michelle Walter, it was a pleasure seeing you today!  I will inform you of your urine culture results as soon as they are available.

## 2019-05-10 NOTE — Progress Notes (Signed)
    Michelle Walter 28-Jun-1960 [id number]        59 y.o.  G2P2L2   RP: Dysuria recurring post treatment of cystitis recently  HPI: Recurrence of burning and frequency of urination post treatment of E. coli with Bactrim from January 19 to January 21.  Some suprapubic discomfort.  No back pain.  No blood in urine.  No fever.   OB History  Gravida Para Term Preterm AB Living  2 2       2   SAB TAB Ectopic Multiple Live Births               # Outcome Date GA Lbr Len/2nd Weight Sex Delivery Anes PTL Lv  2 Para           1 Para             Past medical history,surgical history, problem list, medications, allergies, family history and social history were all reviewed and documented in the EPIC chart.   Directed ROS with pertinent positives and negatives documented in the history of present illness/assessment and plan.  Exam:  Vitals:   05/10/19 1542  BP: 124/80   General appearance:  Normal  CVAT Negative bilaterally  Abdomen: Soft, NT  Gynecologic exam: Vulva normal.  Bimanual exam:  Uterus AV, mobile, normal volume, NT.  No adnexal mass, NT bilaterally.  U/A: Yellow cloudy, protein negative, nitrites negative, white blood cells 6-10, red blood cells negative, few bacteria.  Pending urine culture.   Assessment/Plan:  59 y.o. G2P2   1. Dysuria Probable persistence of acute cystitis post Bactrim treatment April 30, 2019.  Urine culture showed E. coli sensitive to Bactrim but with a sensitivity more or equal to 20.  Improvement of symptoms at that time but now recurrence rapidly after.  Urine analysis today is not as perturbed but still not normal.  Decision to treat with ciprofloxacin which had a sensitivity more or equal to 0.25 on urine culture positive for E. coli at last visit.  Usage reviewed with patient.  Prescription sent to pharmacy.  Will cover with fluconazole as needed again.  Will adjust management per urine culture results and response to Cipro. -  Urinalysis,Complete w/RFL Culture  2. Suprapubic pain As above.  Other orders - ciprofloxacin (CIPRO) 500 MG tablet; Take 1 tablet (500 mg total) by mouth 2 (two) times daily for 7 days. - fluconazole (DIFLUCAN) 150 MG tablet; Take 1 tablet (150 mg total) by mouth daily for 3 days.  May 02, 2019 MD, 4:04 PM 05/10/2019

## 2019-05-12 LAB — URINALYSIS, COMPLETE W/RFL CULTURE
Bilirubin Urine: NEGATIVE
Glucose, UA: NEGATIVE
Hgb urine dipstick: NEGATIVE
Hyaline Cast: NONE SEEN /LPF
Ketones, ur: NEGATIVE
Nitrites, Initial: NEGATIVE
Protein, ur: NEGATIVE
RBC / HPF: NONE SEEN /HPF (ref 0–2)
Specific Gravity, Urine: 1.015 (ref 1.001–1.03)
pH: 5 (ref 5.0–8.0)

## 2019-05-12 LAB — URINE CULTURE
MICRO NUMBER:: 10096307
SPECIMEN QUALITY:: ADEQUATE

## 2019-05-12 LAB — CULTURE INDICATED

## 2019-06-03 ENCOUNTER — Other Ambulatory Visit: Payer: Self-pay

## 2019-06-03 ENCOUNTER — Ambulatory Visit
Admission: RE | Admit: 2019-06-03 | Discharge: 2019-06-03 | Disposition: A | Discharge status: Home / Self Care | Payer: BC Managed Care – PPO | Source: Ambulatory Visit | Attending: Obstetrics & Gynecology | Admitting: Obstetrics & Gynecology

## 2019-06-03 DIAGNOSIS — Z1231 Encounter for screening mammogram for malignant neoplasm of breast: Secondary | ICD-10-CM

## 2019-08-14 ENCOUNTER — Other Ambulatory Visit: Payer: Self-pay

## 2019-08-15 ENCOUNTER — Ambulatory Visit: Payer: BC Managed Care – PPO | Admitting: Obstetrics & Gynecology

## 2019-08-15 ENCOUNTER — Encounter: Payer: Self-pay | Admitting: Obstetrics & Gynecology

## 2019-08-15 VITALS — Ht 61.5 in | Wt 154.4 lb

## 2019-08-15 DIAGNOSIS — R87610 Atypical squamous cells of undetermined significance on cytologic smear of cervix (ASC-US): Secondary | ICD-10-CM

## 2019-08-15 DIAGNOSIS — Z780 Asymptomatic menopausal state: Secondary | ICD-10-CM

## 2019-08-15 DIAGNOSIS — Z01419 Encounter for gynecological examination (general) (routine) without abnormal findings: Secondary | ICD-10-CM

## 2019-08-15 DIAGNOSIS — E663 Overweight: Secondary | ICD-10-CM

## 2019-08-15 LAB — VITAMIN D 25 HYDROXY (VIT D DEFICIENCY, FRACTURES): Vit D, 25-Hydroxy: 34 ng/mL (ref 30–100)

## 2019-08-15 NOTE — Progress Notes (Signed)
Michelle Walter Feb 17, 1961 [id number]   History:    59 y.o. G2P2L2 Married  RP:  Established patient presenting for annual gyn exam   HPI: Menopause, well on no hormone replacement therapy.  No postmenopausal bleeding.  No pelvic pain.  No pain with intercourse.  Normal vaginal secretions.  Urine and bowel movements normal.  Breasts normal.  Body mass index 28.7.  Health labs with family physician.  Colonoscopy in 2019.  Past medical history,surgical history, family history and social history were all reviewed and documented in the EPIC chart.  Gynecologic History Patient's last menstrual period was 09/22/2013.  Obstetric History OB History  Gravida Para Term Preterm AB Living  2 2       2   SAB TAB Ectopic Multiple Live Births               # Outcome Date GA Lbr Len/2nd Weight Sex Delivery Anes PTL Lv  2 Para           1 Para              ROS: A ROS was performed and pertinent positives and negatives are included in the history.  GENERAL: No fevers or chills. HEENT: No change in vision, no earache, sore throat or sinus congestion. NECK: No pain or stiffness. CARDIOVASCULAR: No chest pain or pressure. No palpitations. PULMONARY: No shortness of breath, cough or wheeze. GASTROINTESTINAL: No abdominal pain, nausea, vomiting or diarrhea, melena or bright red blood per rectum. GENITOURINARY: No urinary frequency, urgency, hesitancy or dysuria. MUSCULOSKELETAL: No joint or muscle pain, no back pain, no recent trauma. DERMATOLOGIC: No rash, no itching, no lesions. ENDOCRINE: No polyuria, polydipsia, no heat or cold intolerance. No recent change in weight. HEMATOLOGICAL: No anemia or easy bruising or bleeding. NEUROLOGIC: No headache, seizures, numbness, tingling or weakness. PSYCHIATRIC: No depression, no loss of interest in normal activity or change in sleep pattern.     Exam:   Ht 5' 1.5" (1.562 m)   Wt 154 lb 6.4 oz (70 kg)   LMP 09/22/2013   BMI 28.70 kg/m   Body mass index  is 28.7 kg/m.  General appearance : Well developed well nourished female. No acute distress HEENT: Eyes: no retinal hemorrhage or exudates,  Neck supple, trachea midline, no carotid bruits, no thyroidmegaly Lungs: Clear to auscultation, no rhonchi or wheezes, or rib retractions  Heart: Regular rate and rhythm, no murmurs or gallops Breast:Examined in sitting and supine position were symmetrical in appearance, no palpable masses or tenderness,  no skin retraction, no nipple inversion, no nipple discharge, no skin discoloration, no axillary or supraclavicular lymphadenopathy Abdomen: no palpable masses or tenderness, no rebound or guarding Extremities: no edema or skin discoloration or tenderness  Pelvic: Vulva: Normal             Vagina: No gross lesions or discharge  Cervix: No gross lesions or discharge.  Pap reflex done.  Uterus  AV, normal size, shape and consistency, non-tender and mobile  Adnexa  Without masses or tenderness  Anus: Normal   Assessment/Plan:  59 y.o. female for annual exam   1. Encounter for routine gynecological examination with Papanicolaou smear of cervix Normal gynecologic exam in menopause.  ASCUS with negative high-risk HPV last year, Pap reflex done this year.  Breast exam normal.  Screening mammogram February 2021 was negative.  Colonoscopy about 2 years ago.  Health labs with family physician.  2. ASCUS of cervix with negative high risk HPV  3. Postmenopause Well on no hormone replacement therapy.  No postmenopausal bleeding.  Last bone density in 2019 was normal.  Will repeat a bone density at 5 years.  Will check vitamin D level today.  Calcium intake of 1200 mg daily recommended.  Regular weightbearing physical activities recommended. - VITAMIN D 25 Hydroxy (Vit-D Deficiency, Fractures)  4. Overweight (BMI 25.0-29.9) Lower calorie/carb diet such as [employer].  Aerobic activities 5 times a week and light weightlifting every 2 days.  Other  orders - Multiple Vitamin (MULTIVITAMIN) tablet; Take 1 tablet by mouth daily. - Ascorbic Acid (VITAMIN C) 100 MG tablet; Take 100 mg by mouth daily.  Princess Bruins MD, 8:56 AM 08/15/2019

## 2019-08-15 NOTE — Patient Instructions (Signed)
1. Encounter for routine gynecological examination with Papanicolaou smear of cervix Normal gynecologic exam in menopause.  ASCUS with negative high-risk HPV last year, Pap reflex done this year.  Breast exam normal.  Screening mammogram February 2021 was negative.  Colonoscopy about 2 years ago.  Health labs with family physician.  2. ASCUS of cervix with negative high risk HPV  3. Postmenopause Well on no hormone replacement therapy.  No postmenopausal bleeding.  Last bone density in 2019 was normal.  Will repeat a bone density at 5 years.  Will check vitamin D level today.  Calcium intake of 1200 mg daily recommended.  Regular weightbearing physical activities recommended. - VITAMIN D 25 Hydroxy (Vit-D Deficiency, Fractures)  4. Overweight (BMI 25.0-29.9) Lower calorie/carb diet such as [employer].  Aerobic activities 5 times a week and light weightlifting every 2 days.  Other orders - Multiple Vitamin (MULTIVITAMIN) tablet; Take 1 tablet by mouth daily. - Ascorbic Acid (VITAMIN C) 100 MG tablet; Take 100 mg by mouth daily.  Temperance, it was a pleasure seeing you today!  I will inform you of your results as soon as they are available.

## 2019-08-15 NOTE — Addendum Note (Signed)
Addended by: Berna Spare A on: 08/15/2019 10:17 AM   Modules accepted: Orders

## 2019-08-20 LAB — PAP IG W/ RFLX HPV ASCU

## 2019-08-21 ENCOUNTER — Encounter: Payer: Self-pay | Admitting: *Deleted

## 2020-05-11 ENCOUNTER — Other Ambulatory Visit: Payer: Self-pay | Admitting: Obstetrics & Gynecology

## 2020-05-11 DIAGNOSIS — Z0000 Encounter for general adult medical examination without abnormal findings: Secondary | ICD-10-CM

## 2020-06-23 ENCOUNTER — Inpatient Hospital Stay: Admission: RE | Admit: 2020-06-23 | Payer: BC Managed Care – PPO | Source: Ambulatory Visit

## 2020-06-24 ENCOUNTER — Inpatient Hospital Stay: Admission: RE | Admit: 2020-06-24 | Payer: Self-pay | Source: Ambulatory Visit

## 2020-08-17 ENCOUNTER — Ambulatory Visit
Admission: RE | Admit: 2020-08-17 | Discharge: 2020-08-17 | Disposition: A | Discharge status: Home / Self Care | Payer: BC Managed Care – PPO | Source: Ambulatory Visit | Attending: Obstetrics & Gynecology | Admitting: Obstetrics & Gynecology

## 2020-08-17 ENCOUNTER — Other Ambulatory Visit: Payer: Self-pay

## 2020-08-17 DIAGNOSIS — Z0000 Encounter for general adult medical examination without abnormal findings: Secondary | ICD-10-CM

## 2020-11-23 ENCOUNTER — Telehealth: Payer: Self-pay | Admitting: *Deleted

## 2020-11-23 NOTE — Telephone Encounter (Signed)
Patient called with complaints of vaginal odor, vaginal discharge, dysuria. Patient is in [facility]. Requesting to be treated over phone. I will route to Provider for recommendations.

## 2020-11-24 MED ORDER — SULFAMETHOXAZOLE-TRIMETHOPRIM 800-160 MG PO TABS: 1.0000 | ORAL_TABLET | Freq: Two times a day (BID) | ORAL | 0 refills | Status: DC

## 2020-11-24 MED ORDER — TINIDAZOLE 500 MG PO TABS: 1000.0000 mg | ORAL_TABLET | Freq: Two times a day (BID) | ORAL | 0 refills | Status: DC

## 2020-12-18 ENCOUNTER — Other Ambulatory Visit: Payer: Self-pay | Admitting: Family Medicine

## 2020-12-18 DIAGNOSIS — E041 Nontoxic single thyroid nodule: Secondary | ICD-10-CM

## 2020-12-23 ENCOUNTER — Ambulatory Visit
Admission: RE | Admit: 2020-12-23 | Discharge: 2020-12-23 | Disposition: A | Discharge status: Home / Self Care | Payer: BC Managed Care – PPO | Source: Ambulatory Visit | Attending: Family Medicine | Admitting: Family Medicine

## 2020-12-23 DIAGNOSIS — E041 Nontoxic single thyroid nodule: Secondary | ICD-10-CM

## 2021-07-14 ENCOUNTER — Other Ambulatory Visit: Payer: Self-pay | Admitting: Obstetrics & Gynecology

## 2021-07-14 DIAGNOSIS — Z1231 Encounter for screening mammogram for malignant neoplasm of breast: Secondary | ICD-10-CM

## 2021-08-20 ENCOUNTER — Ambulatory Visit
Admission: RE | Admit: 2021-08-20 | Discharge: 2021-08-20 | Disposition: A | Discharge status: Home / Self Care | Payer: BC Managed Care – PPO | Source: Ambulatory Visit | Attending: Obstetrics & Gynecology | Admitting: Obstetrics & Gynecology

## 2021-08-20 DIAGNOSIS — Z1231 Encounter for screening mammogram for malignant neoplasm of breast: Secondary | ICD-10-CM

## 2021-09-21 ENCOUNTER — Other Ambulatory Visit (HOSPITAL_COMMUNITY)
Admission: RE | Admit: 2021-09-21 | Discharge: 2021-09-21 | Disposition: A | Discharge status: Home / Self Care | Payer: BC Managed Care – PPO | Source: Ambulatory Visit | Attending: Obstetrics & Gynecology | Admitting: Obstetrics & Gynecology

## 2021-09-21 ENCOUNTER — Encounter: Payer: Self-pay | Admitting: Obstetrics & Gynecology

## 2021-09-21 ENCOUNTER — Ambulatory Visit (INDEPENDENT_AMBULATORY_CARE_PROVIDER_SITE_OTHER): Payer: BC Managed Care – PPO | Admitting: Obstetrics & Gynecology

## 2021-09-21 VITALS — BP 114/76 | HR 79 | Ht 61.25 in | Wt 155.0 lb

## 2021-09-21 DIAGNOSIS — Z01419 Encounter for gynecological examination (general) (routine) without abnormal findings: Secondary | ICD-10-CM

## 2021-09-21 DIAGNOSIS — Z780 Asymptomatic menopausal state: Secondary | ICD-10-CM

## 2021-09-21 NOTE — Progress Notes (Signed)
Michelle Walter [birth year] [id number]   History:    61 y.o. G2P2L2 Married   RP:  Established patient presenting for annual gyn exam    HPI: Postmenopause, well on no hormone replacement therapy.  No postmenopausal bleeding.  No pelvic pain.  No pain with intercourse.  Normal vaginal secretions. Pap Neg 08/2019.  Pap reflex today. Urine and bowel movements normal.  Breasts normal.  Screening Mammo Neg 08/2021.  Body mass index 29.05. Walking. Health labs with family physician.  Colonoscopy in 2019. BMD: 06-22-17 was normal.  Past medical history,surgical history, family history and social history were all reviewed and documented in the EPIC chart.  Gynecologic History Patient's last menstrual period was 09/22/2013.  Obstetric History OB History  Gravida Para Term Preterm AB Living  2 2       2   SAB IAB Ectopic Multiple Live Births               # Outcome Date GA Lbr Len/2nd Weight Sex Delivery Anes PTL Lv  2 Para           1 Para              ROS: A ROS was performed and pertinent positives and negatives are included in the history.  GENERAL: No fevers or chills. HEENT: No change in vision, no earache, sore throat or sinus congestion. NECK: No pain or stiffness. CARDIOVASCULAR: No chest pain or pressure. No palpitations. PULMONARY: No shortness of breath, cough or wheeze. GASTROINTESTINAL: No abdominal pain, nausea, vomiting or diarrhea, melena or bright red blood per rectum. GENITOURINARY: No urinary frequency, urgency, hesitancy or dysuria. MUSCULOSKELETAL: No joint or muscle pain, no back pain, no recent trauma. DERMATOLOGIC: No rash, no itching, no lesions. ENDOCRINE: No polyuria, polydipsia, no heat or cold intolerance. No recent change in weight. HEMATOLOGICAL: No anemia or easy bruising or bleeding. NEUROLOGIC: No headache, seizures, numbness, tingling or weakness. PSYCHIATRIC: No depression, no loss of interest in normal activity or change in sleep pattern.     Exam:   BP  114/76   Pulse 79   Ht 5' 1.25" (1.556 m)   Wt 155 lb (70.3 kg)   LMP 09/22/2013   SpO2 98%   BMI 29.05 kg/m   Body mass index is 29.05 kg/m.  General appearance : Well developed well nourished female. No acute distress HEENT: Eyes: no retinal hemorrhage or exudates,  Neck supple, trachea midline, no carotid bruits, no thyroidmegaly Lungs: Clear to auscultation, no rhonchi or wheezes, or rib retractions  Heart: Regular rate and rhythm, no murmurs or gallops Breast:Examined in sitting and supine position were symmetrical in appearance, no palpable masses or tenderness,  no skin retraction, no nipple inversion, no nipple discharge, no skin discoloration, no axillary or supraclavicular lymphadenopathy Abdomen: no palpable masses or tenderness, no rebound or guarding Extremities: no edema or skin discoloration or tenderness  Pelvic: Vulva: Normal             Vagina: No gross lesions or discharge  Cervix: No gross lesions or discharge.  Pap reflex done  Uterus  AV, normal size, shape and consistency, non-tender and mobile  Adnexa  Without masses or tenderness  Anus: Normal   Assessment/Plan:  61 y.o. female for annual exam   1. Encounter for routine gynecological examination with Papanicolaou smear of cervix Postmenopause, well on no hormone replacement therapy.  No postmenopausal bleeding.  No pelvic pain.  No pain with intercourse.  Normal vaginal secretions. Pap  Neg 08/2019.  Pap reflex today. Urine and bowel movements normal.  Breasts normal.  Screening Mammo Neg 08/2021.  Body mass index 29.05. Walking. Health labs with family physician.  Colonoscopy in 2019. BMD: 06-22-17 was normal. - Cytology - PAP( [facility])  2. Postmenopause Postmenopause, well on no hormone replacement therapy.  No postmenopausal bleeding.  No pelvic pain.  No pain with intercourse.  Normal vaginal secretions.  BD normal in 06/2017.  Vit D supplement, Ca++ 1.5 g/d today and regular weight bearing physical  activities.  Other orders - acetaminophen-codeine (TYLENOL #3) 300-30 MG tablet; Take 1 tablet by mouth every 6 (six) hours as needed. - LINZESS 145 MCG CAPS capsule; Take 145 mcg by mouth every morning. - pantoprazole (PROTONIX) 40 MG tablet; Take 40 mg by mouth daily. - losartan-hydrochlorothiazide (HYZAAR) 50-12.5 MG tablet; Take 1 tablet by mouth daily.   Princess Bruins MD, 9:38 AM 09/21/2021

## 2021-09-22 LAB — CYTOLOGY - PAP: Diagnosis: NEGATIVE

## 2022-01-27 ENCOUNTER — Other Ambulatory Visit: Payer: Self-pay | Admitting: Family Medicine

## 2022-01-27 DIAGNOSIS — E2839 Other primary ovarian failure: Secondary | ICD-10-CM

## 2022-02-10 IMAGING — MG DIGITAL SCREENING BILAT W/ TOMO W/ CAD
6 of 12 series · 6 of 36 positions shown · non-contrast
Comparison: Previous exam(s).

CLINICAL DATA: Screening.

EXAM:
DIGITAL SCREENING BILATERAL MAMMOGRAM WITH TOMO AND CAD

[L CC synth-2D]
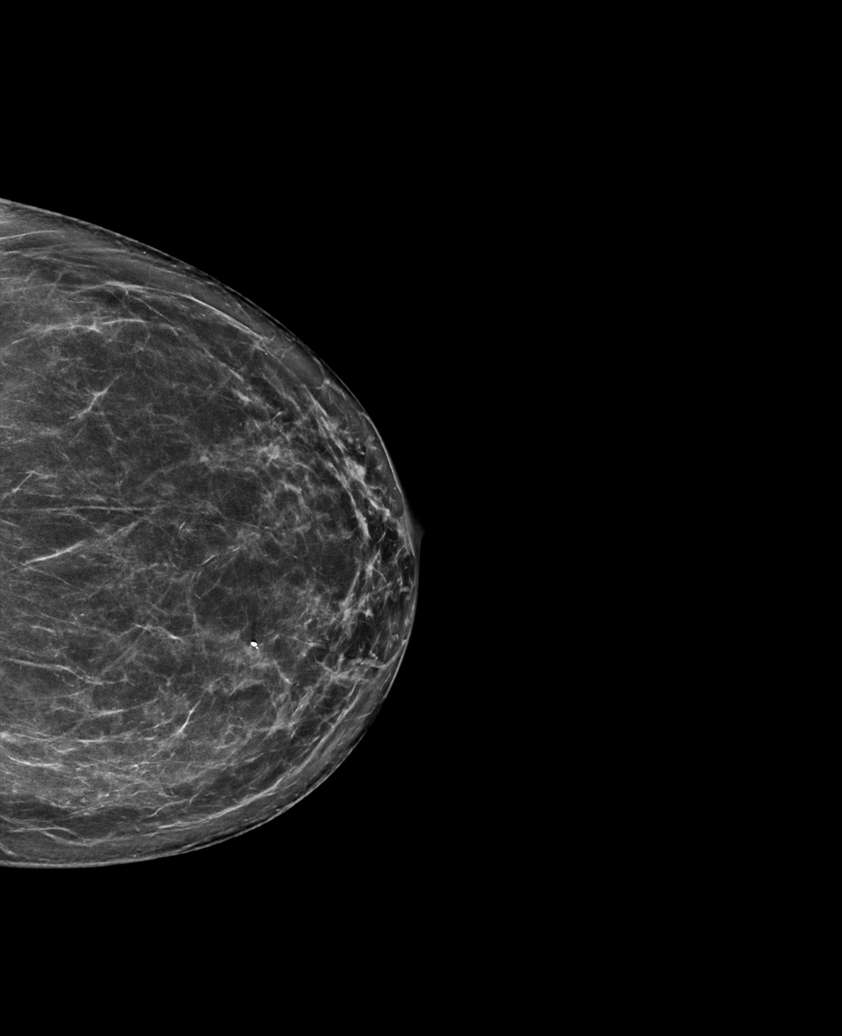

[R CC synth-2D (1 of 2)]
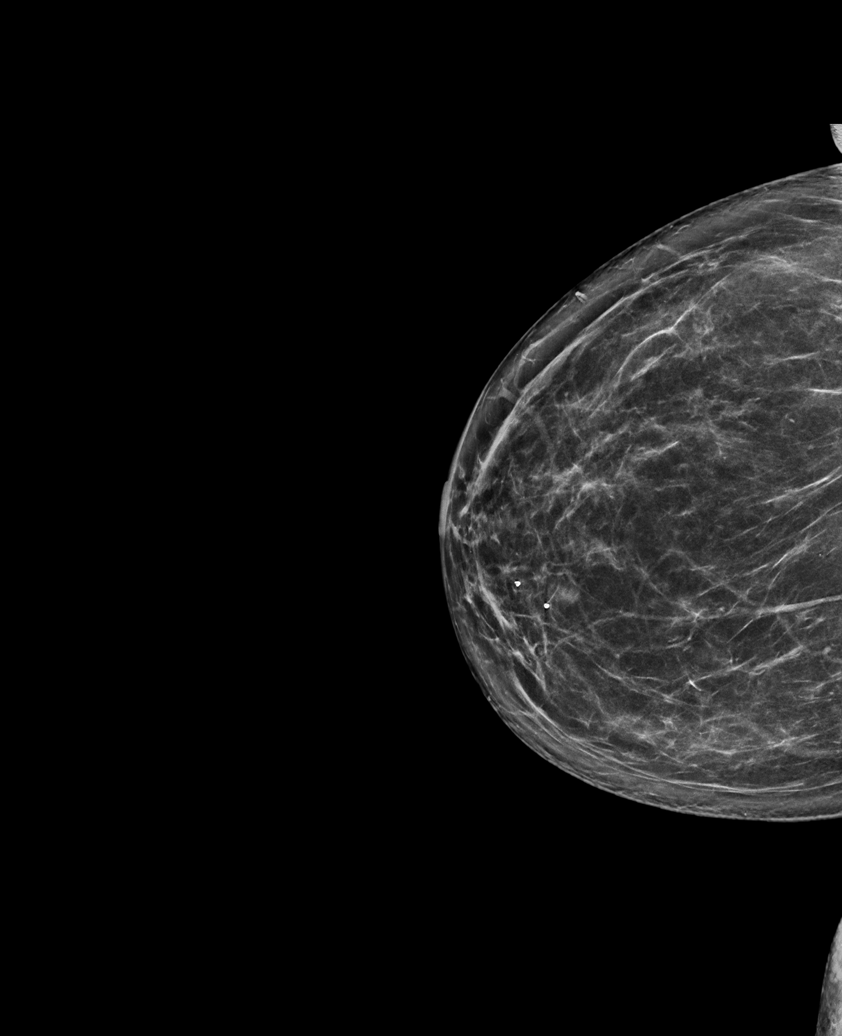

[L MLO synth-2D (1 of 2)]
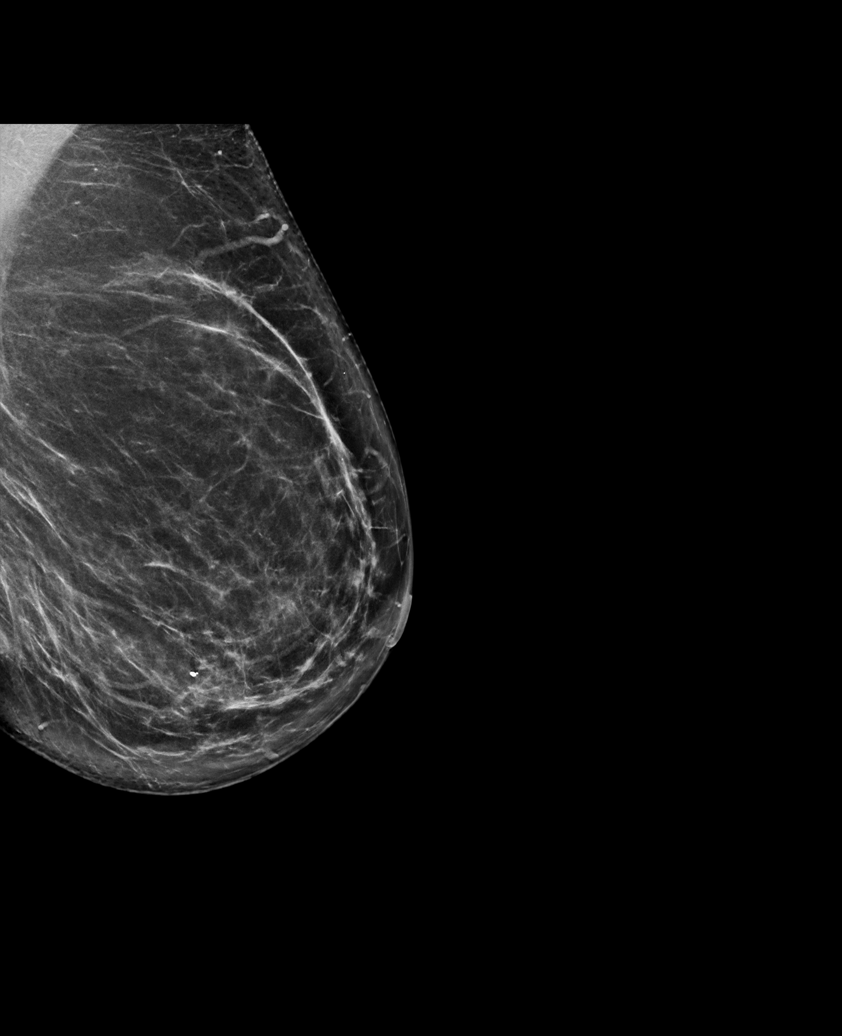

[R MLO synth-2D]
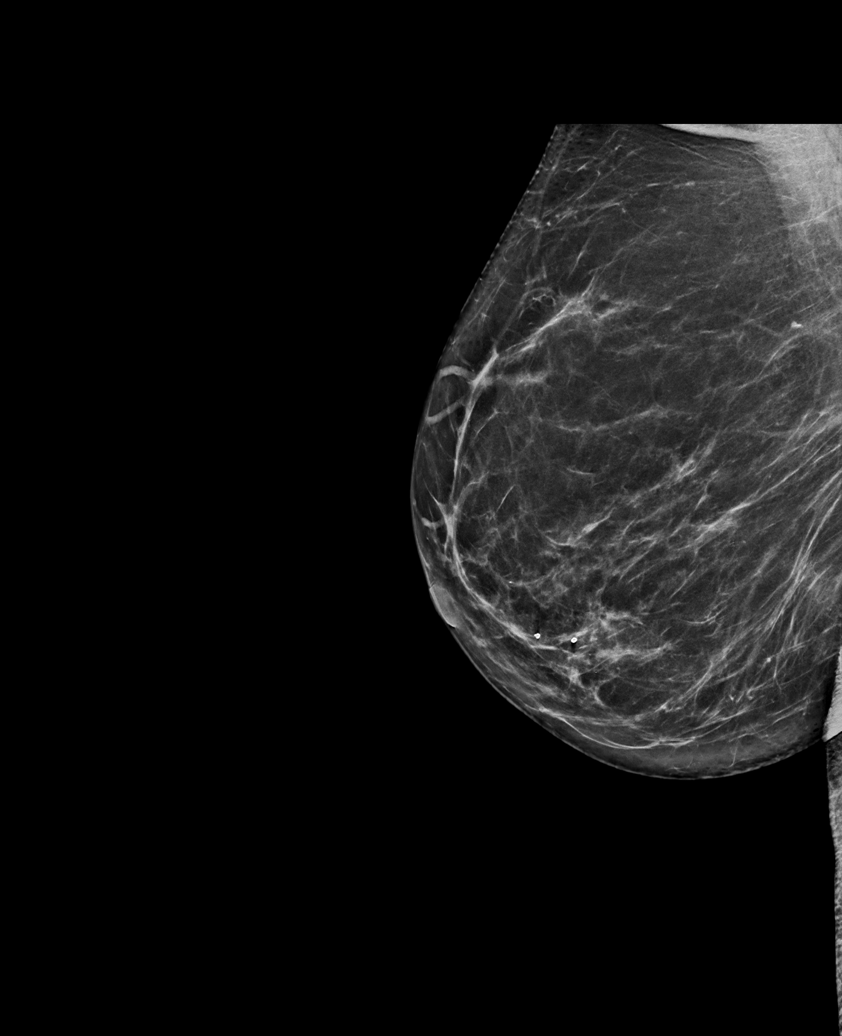

[L MLO synth-2D (2 of 2)]
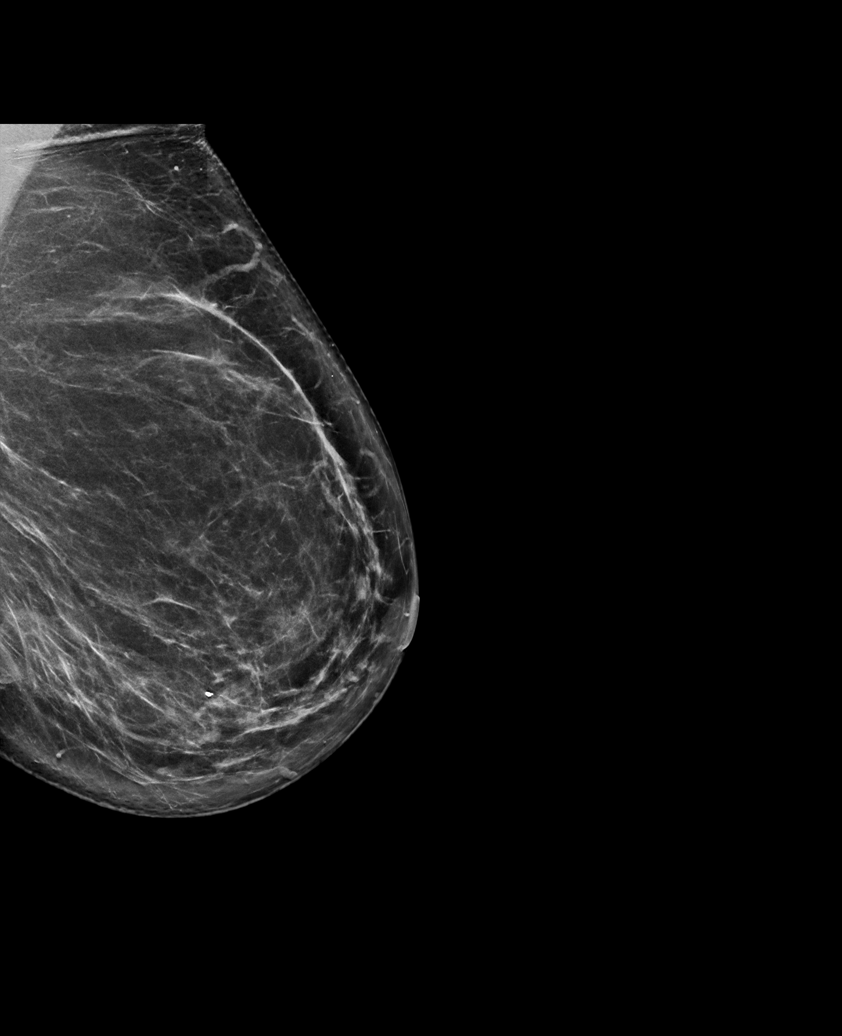

[R CC synth-2D (2 of 2)]
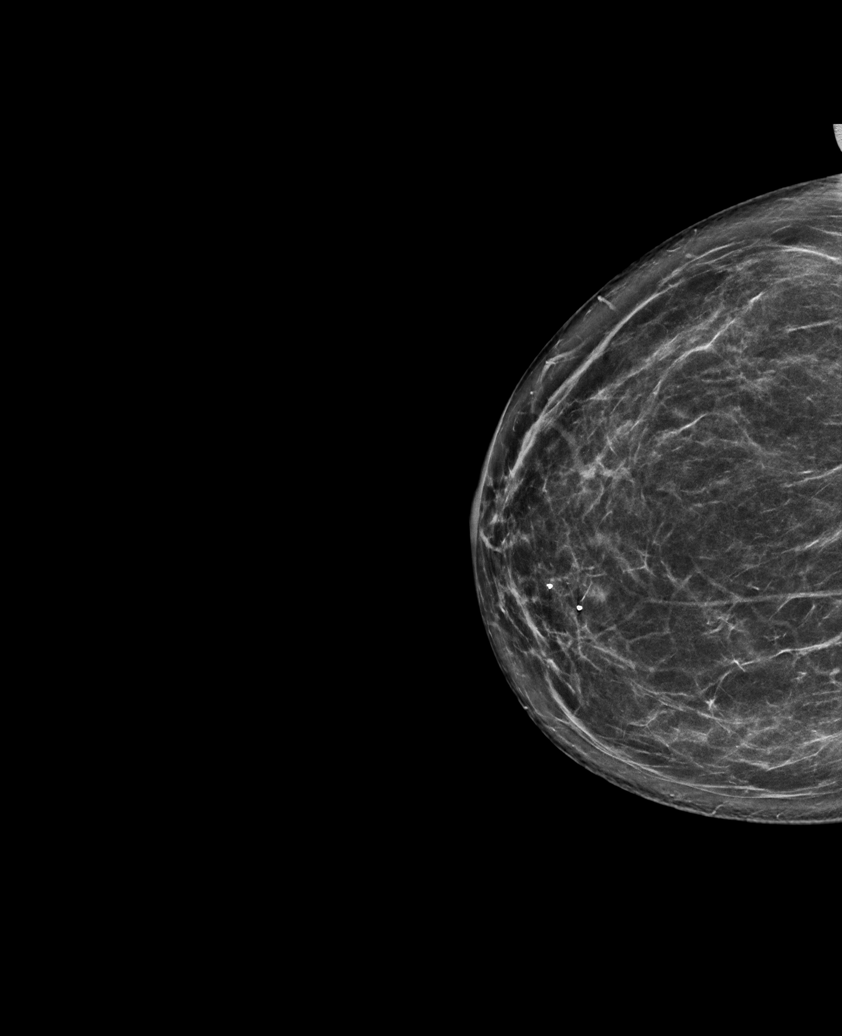

[6 of 36 positions shown; findings below may reference images not displayed]

ACR Breast Density Category b: There are scattered areas of
fibroglandular density.
FINDINGS: There are no findings suspicious for malignancy. Images were
processed with CAD.
IMPRESSION: No mammographic evidence of malignancy. A result letter of this
screening mammogram will be mailed directly to the patient.

RECOMMENDATION:
Screening mammogram in one year. (Code:[id number])

BI-RADS CATEGORY  1: Negative.

## 2022-05-12 ENCOUNTER — Other Ambulatory Visit: Payer: Self-pay | Admitting: Obstetrics & Gynecology

## 2022-05-12 DIAGNOSIS — Z1231 Encounter for screening mammogram for malignant neoplasm of breast: Secondary | ICD-10-CM

## 2022-07-19 ENCOUNTER — Ambulatory Visit
Admission: RE | Admit: 2022-07-19 | Discharge: 2022-07-19 | Disposition: A | Discharge status: Home / Self Care | Payer: BC Managed Care – PPO | Source: Ambulatory Visit | Attending: Family Medicine | Admitting: Family Medicine

## 2022-07-19 DIAGNOSIS — E2839 Other primary ovarian failure: Secondary | ICD-10-CM

## 2022-08-23 ENCOUNTER — Ambulatory Visit
Admission: RE | Admit: 2022-08-23 | Discharge: 2022-08-23 | Disposition: A | Discharge status: Home / Self Care | Payer: BC Managed Care – PPO | Source: Ambulatory Visit | Attending: Obstetrics & Gynecology | Admitting: Obstetrics & Gynecology

## 2022-08-23 DIAGNOSIS — Z1231 Encounter for screening mammogram for malignant neoplasm of breast: Secondary | ICD-10-CM

## 2022-09-19 LAB — HM COLONOSCOPY

## 2022-09-28 ENCOUNTER — Ambulatory Visit (INDEPENDENT_AMBULATORY_CARE_PROVIDER_SITE_OTHER): Payer: BC Managed Care – PPO | Admitting: Obstetrics & Gynecology

## 2022-09-28 ENCOUNTER — Encounter: Payer: Self-pay | Admitting: Obstetrics & Gynecology

## 2022-09-28 ENCOUNTER — Other Ambulatory Visit (HOSPITAL_COMMUNITY)
Admission: RE | Admit: 2022-09-28 | Discharge: 2022-09-28 | Disposition: A | Discharge status: Home / Self Care | Payer: BC Managed Care – PPO | Source: Ambulatory Visit | Attending: Obstetrics & Gynecology | Admitting: Obstetrics & Gynecology

## 2022-09-28 VITALS — BP 110/70 | HR 81 | Ht 61.0 in | Wt 152.0 lb

## 2022-09-28 DIAGNOSIS — Z01419 Encounter for gynecological examination (general) (routine) without abnormal findings: Secondary | ICD-10-CM

## 2022-09-28 DIAGNOSIS — Z780 Asymptomatic menopausal state: Secondary | ICD-10-CM | POA: Diagnosis not present

## 2022-09-28 DIAGNOSIS — M8588 Other specified disorders of bone density and structure, other site: Secondary | ICD-10-CM | POA: Diagnosis not present

## 2022-09-28 NOTE — Progress Notes (Signed)
Michelle Walter Sep 11, 1960 [id number]   History:    62 y.o. G2P2L2 Married   RP:  Established patient presenting for annual gyn exam    HPI: Postmenopause, well on no hormone replacement therapy.  No postmenopausal bleeding.  S/P Left Oophorectomy. No pelvic pain.  No pain with intercourse.  Normal vaginal secretions. Pap Neg 09/2021. Pap reflex today. Urine and bowel movements normal.  Breasts normal. Screening Mammo Neg 08/2022.  Body mass index 28.72. Walking. Health labs with family physician.  Colonoscopy in 09/2022. BMD: 07/2022 showed Osteopenia with T-Score of -1.3 at the [facility].   Past medical history,surgical history, family history and social history were all reviewed and documented in the EPIC chart.  Gynecologic History Patient's last menstrual period was 09/22/2013.  Obstetric History OB History  Gravida Para Term Preterm AB Living  2 2 2     2   SAB IAB Ectopic Multiple Live Births               # Outcome Date GA Lbr Len/2nd Weight Sex Delivery Anes PTL Lv  2 Term           1 Term              ROS: A ROS was performed and pertinent positives and negatives are included in the history. GENERAL: No fevers or chills. HEENT: No change in vision, no earache, sore throat or sinus congestion. NECK: No pain or stiffness. CARDIOVASCULAR: No chest pain or pressure. No palpitations. PULMONARY: No shortness of breath, cough or wheeze. GASTROINTESTINAL: No abdominal pain, nausea, vomiting or diarrhea, melena or bright red blood per rectum. GENITOURINARY: No urinary frequency, urgency, hesitancy or dysuria. MUSCULOSKELETAL: No joint or muscle pain, no back pain, no recent trauma. DERMATOLOGIC: No rash, no itching, no lesions. ENDOCRINE: No polyuria, polydipsia, no heat or cold intolerance. No recent change in weight. HEMATOLOGICAL: No anemia or easy bruising or bleeding. NEUROLOGIC: No headache, seizures, numbness, tingling or weakness. PSYCHIATRIC: No depression, no loss of interest in  normal activity or change in sleep pattern.     Exam:   BP 110/70   Pulse 81   Ht 5\' 1"  (1.549 m)   Wt 152 lb (68.9 kg)   LMP 09/22/2013 Comment: sexually active  SpO2 97%   BMI 28.72 kg/m   Body mass index is 28.72 kg/m.  General appearance : Well developed well nourished female. No acute distress HEENT: Eyes: no retinal hemorrhage or exudates,  Neck supple, trachea midline, no carotid bruits, no thyroidmegaly Lungs: Clear to auscultation, no rhonchi or wheezes, or rib retractions  Heart: Regular rate and rhythm, no murmurs or gallops Breast:Examined in sitting and supine position were symmetrical in appearance, no palpable masses or tenderness,  no skin retraction, no nipple inversion, no nipple discharge, no skin discoloration, no axillary or supraclavicular lymphadenopathy Abdomen: no palpable masses or tenderness, no rebound or guarding Extremities: no edema or skin discoloration or tenderness  Pelvic: Vulva: Normal             Vagina: No gross lesions or discharge  Cervix: No gross lesions or discharge.  Pap Reflex done.  Uterus  AV, normal size, shape and consistency, non-tender and mobile  Adnexa  Without masses or tenderness  Anus: Normal, except external hemorrhoids, not thrombosed, not bleeding.   Assessment/Plan:  62 y.o. female for annual exam   1. Encounter for routine gynecological examination with Papanicolaou smear of cervix Postmenopause, well on no hormone replacement therapy.  No  postmenopausal bleeding.  S/P Left Oophorectomy. No pelvic pain.  No pain with intercourse.  Normal vaginal secretions. Pap Neg 09/2021. Pap reflex today. Urine and bowel movements normal.  Breasts normal. Screening Mammo Neg 08/2022.  Body mass index 28.72. Walking. Health labs with family physician.  Colonoscopy in 09/2022. BMD: 07/2022 showed Osteopenia with T-Score of -1.3 at the [facility]. - Cytology - PAP( [facility])  2. Postmenopause  Postmenopause, well on no hormone  replacement therapy.  No postmenopausal bleeding.  S/P Left Oophorectomy. No pelvic pain.  No pain with intercourse.  3. Osteopenia of lumbar spine  BMD: 07/2022 showed Osteopenia with T-Score of -1.3 at the [facility].  Genia Del MD, 3:22 PM

## 2022-10-02 LAB — CYTOLOGY - PAP

## 2022-10-03 LAB — CYTOLOGY - PAP
Comment: NEGATIVE
Diagnosis: NEGATIVE
High risk HPV: NEGATIVE

## 2023-07-07 HISTORY — PX: TOTAL SHOULDER ARTHROPLASTY: SHX126

## 2023-07-14 ENCOUNTER — Other Ambulatory Visit: Payer: Self-pay | Admitting: Family Medicine

## 2023-07-14 DIAGNOSIS — Z1231 Encounter for screening mammogram for malignant neoplasm of breast: Secondary | ICD-10-CM

## 2023-08-17 ENCOUNTER — Encounter (HOSPITAL_COMMUNITY): Payer: Self-pay

## 2023-08-21 ENCOUNTER — Ambulatory Visit: Payer: Self-pay

## 2023-08-24 ENCOUNTER — Ambulatory Visit
Admission: RE | Admit: 2023-08-24 | Discharge: 2023-08-24 | Disposition: A | Discharge status: Home / Self Care | Payer: Self-pay | Source: Ambulatory Visit | Attending: Family Medicine | Admitting: Family Medicine

## 2023-08-24 DIAGNOSIS — Z1231 Encounter for screening mammogram for malignant neoplasm of breast: Secondary | ICD-10-CM

## 2023-09-02 IMAGING — US US THYROID
1 series · 13 of 25 positions shown · non-contrast
Comparison: None.

CLINICAL DATA: Prior ultrasound follow-up.

EXAM:
THYROID ULTRASOUND
TECHNIQUE: Ultrasound examination of the thyroid gland and adjacent soft
tissues was performed.

[Series 1: us thyroid · 0.07mm/px · 13 of 49 slices shown]
[im 1/49]
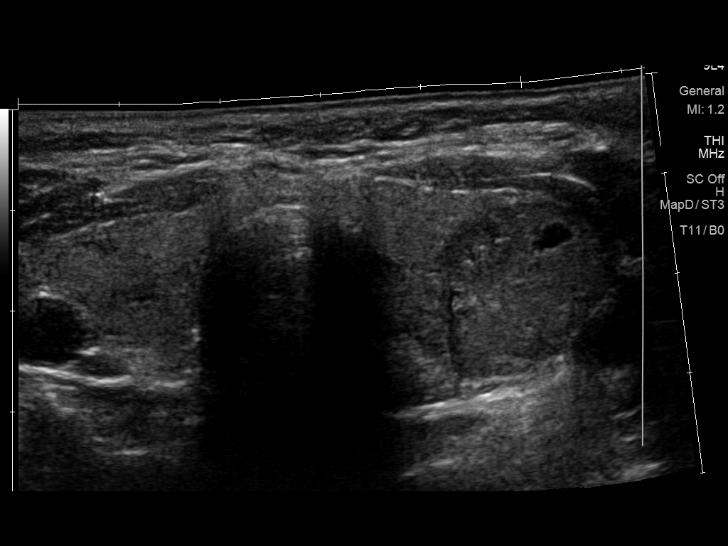
[im 5/49]
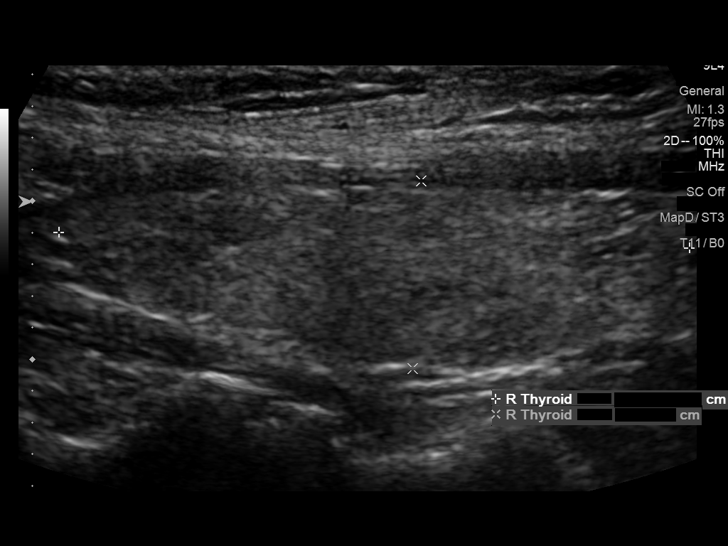
[im 9/49]
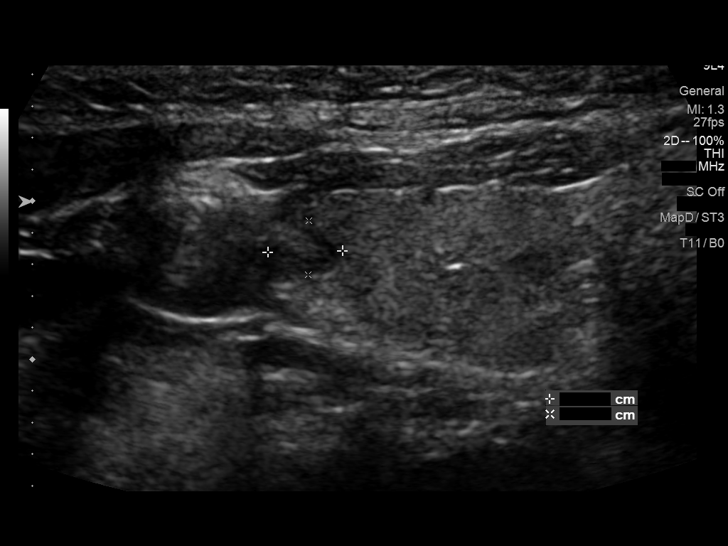
[im 13/49]
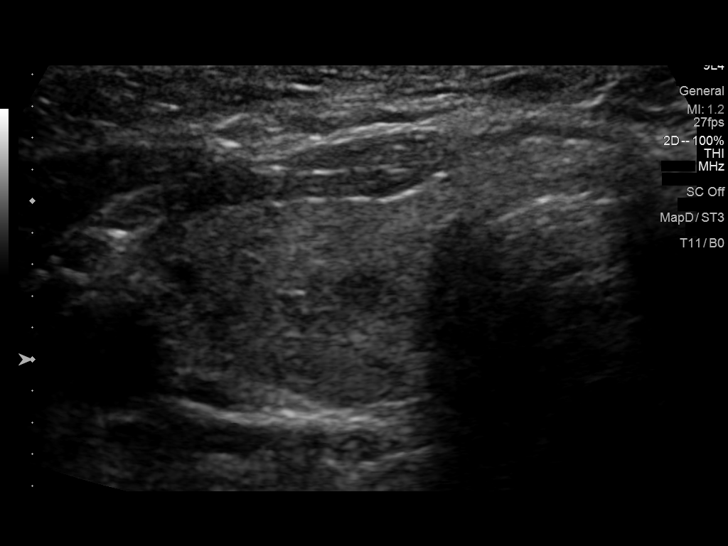
[im 17/49]
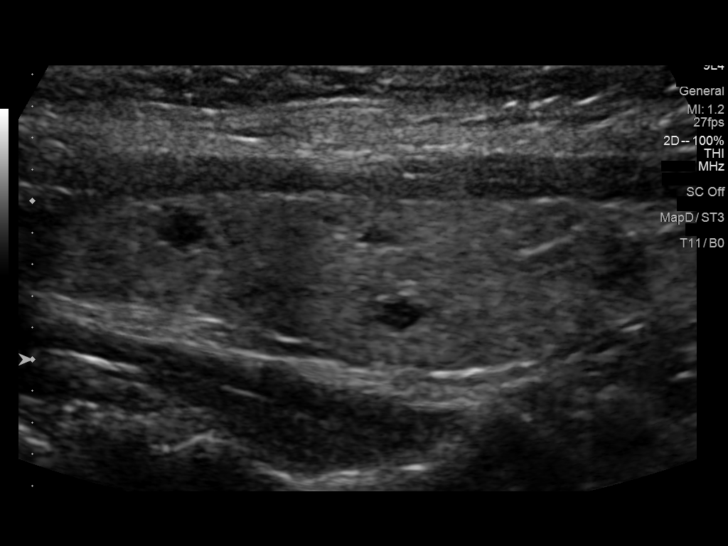
[im 21/49]
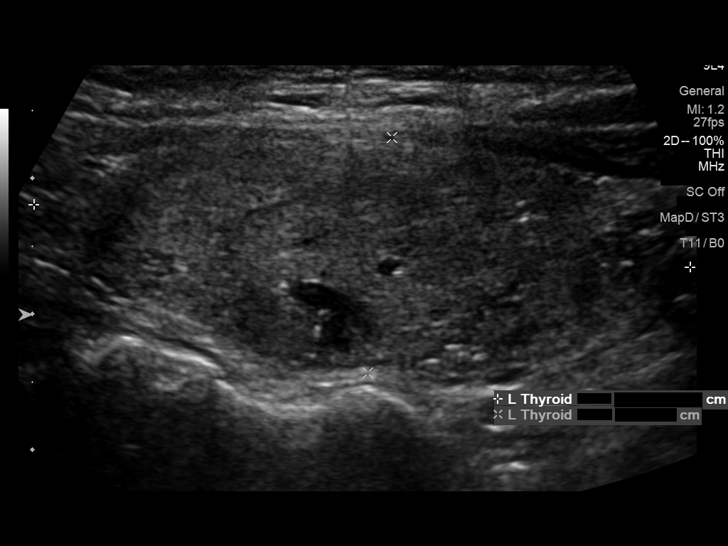
[im 25/49]
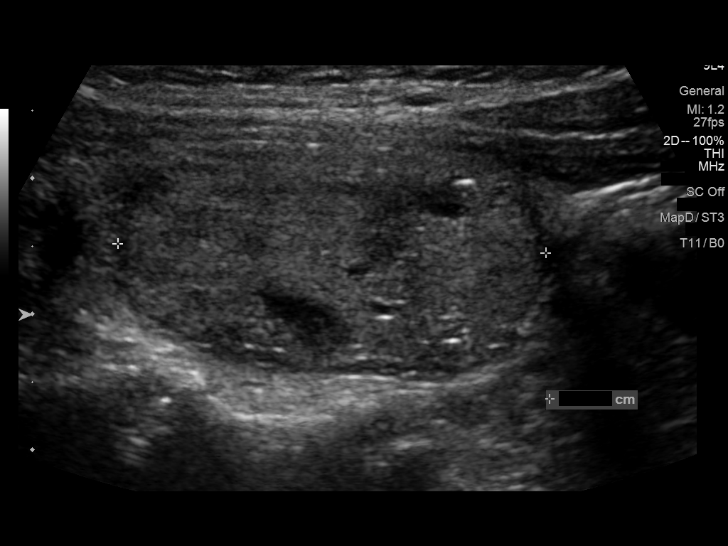
[im 29/49]
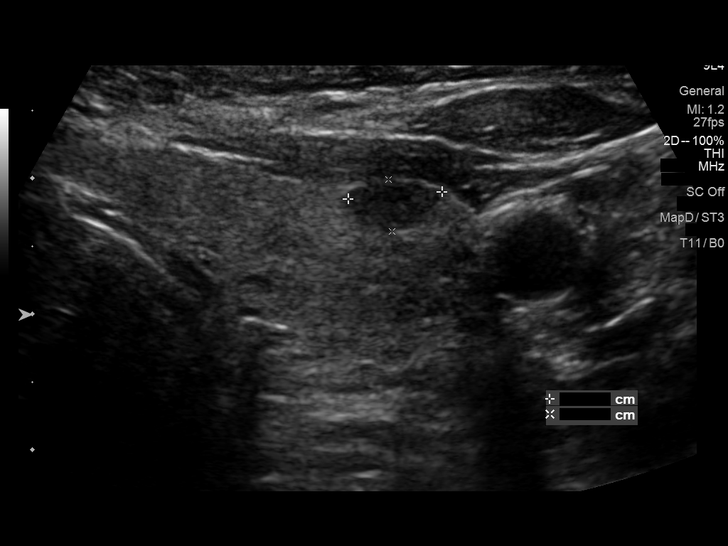
[im 33/49]
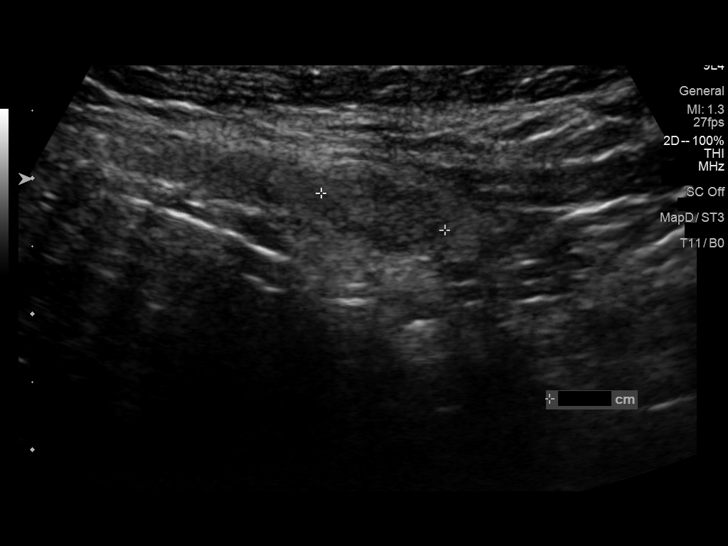
[im 37/49]
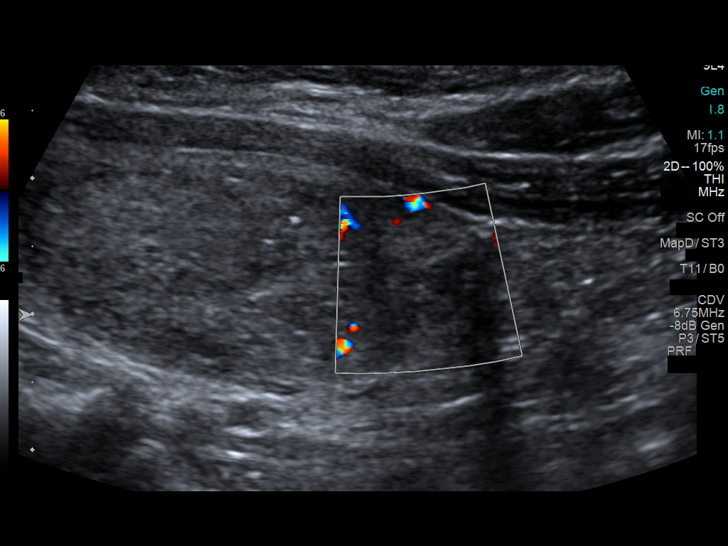
[im 41/49]
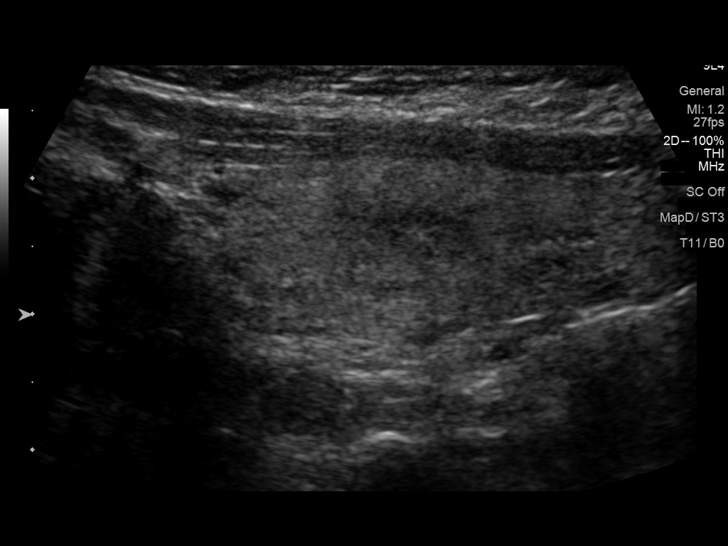
[im 45/49]
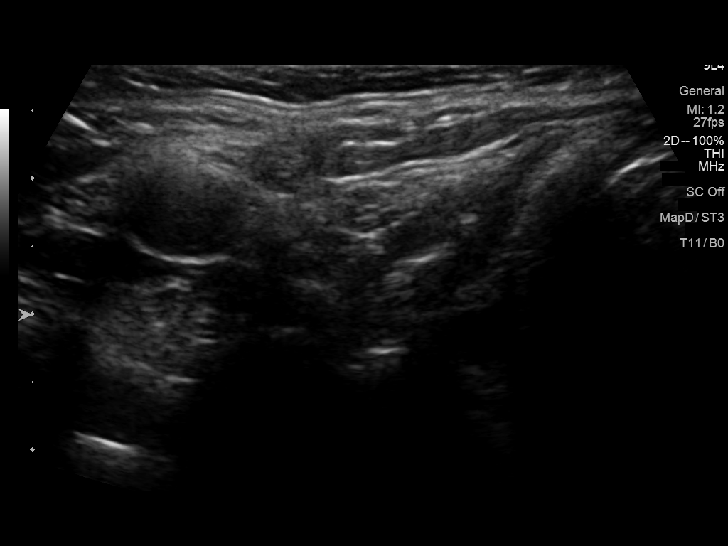
[im 49/49]
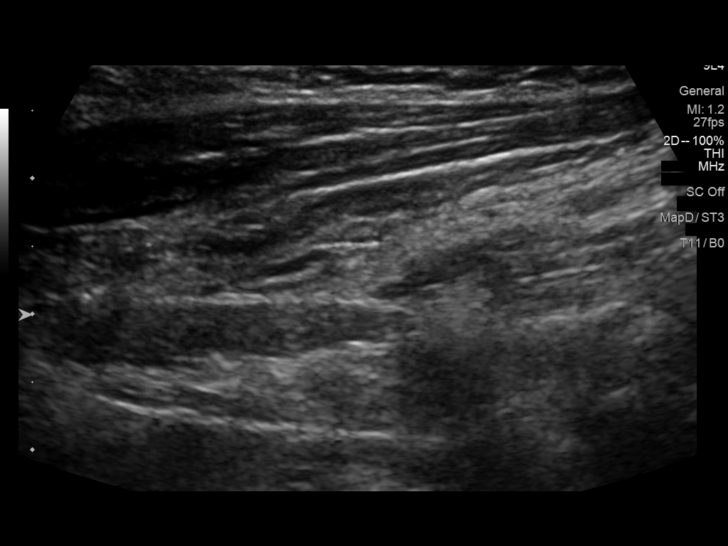

[13 of 25 positions shown; findings below may reference images not displayed]

FINDINGS: Parenchymal Echotexture: Mildly heterogenous

Isthmus: 0.4 cm

Right lobe: 4.0 x 1.2 x 1.7 cm

Left lobe: 4.9 x 1.7 x 2.6 cm

_________________________________________________________

Estimated total number of nodules >/= 1 cm: 1

Number of spongiform nodules >/=  2 cm not described below (TR1): 0

Number of mixed cystic and solid nodules >/= 1.5 cm not described
below (TR2): 0

_________________________________________________________

Nodule labeled 2 in the left mid thyroid gland measures 3.2 x 2.2 x
1.6 cm. It was previously biopsied in 9527. It is not significantly
changed.

Multiple other subcentimeter nodules are found scattered in the
thyroid gland, none of which would meet criteria for dedicated
follow-up or biopsy.
IMPRESSION: 1. Multinodular thyroid gland.
2. Nodule labeled 2 was previously biopsied in 9527. Correlate with
biopsy results.
3. No additional nodules meeting criteria for dedicated follow-up or
biopsy.

The above is in keeping with the ACR TI-RADS recommendations - [HOSPITAL] 0254;[DATE].

## 2023-10-03 ENCOUNTER — Ambulatory Visit (INDEPENDENT_AMBULATORY_CARE_PROVIDER_SITE_OTHER): Payer: Self-pay | Admitting: Radiology

## 2023-10-03 ENCOUNTER — Other Ambulatory Visit (HOSPITAL_COMMUNITY)
Admission: RE | Admit: 2023-10-03 | Discharge: 2023-10-03 | Disposition: A | Discharge status: Home / Self Care | Source: Ambulatory Visit | Attending: Radiology | Admitting: Radiology

## 2023-10-03 ENCOUNTER — Encounter: Payer: Self-pay | Admitting: Radiology

## 2023-10-03 VITALS — BP 126/84 | HR 80 | Ht 62.0 in | Wt 157.8 lb

## 2023-10-03 DIAGNOSIS — K5909 Other constipation: Secondary | ICD-10-CM

## 2023-10-03 DIAGNOSIS — Z1331 Encounter for screening for depression: Secondary | ICD-10-CM | POA: Diagnosis not present

## 2023-10-03 DIAGNOSIS — N952 Postmenopausal atrophic vaginitis: Secondary | ICD-10-CM

## 2023-10-03 DIAGNOSIS — M8588 Other specified disorders of bone density and structure, other site: Secondary | ICD-10-CM | POA: Diagnosis not present

## 2023-10-03 DIAGNOSIS — Z01419 Encounter for gynecological examination (general) (routine) without abnormal findings: Secondary | ICD-10-CM

## 2023-10-03 MED ORDER — ESTRADIOL 0.1 MG/GM VA CREA
1.0000 g | TOPICAL_CREAM | VAGINAL | 3 refills | Status: AC
Start: 2023-10-04 — End: ?

## 2023-10-03 NOTE — Patient Instructions (Signed)
 Preventive Care 16-65 Years Old, Female  Preventive care refers to lifestyle choices and visits with your health care provider that can promote health and wellness. Preventive care visits are also called wellness exams.  What can I expect for my preventive care visit?  Counseling  Your health care provider may ask you questions about your:  Medical history, including:  Past medical problems.  Family medical history.  Pregnancy history.  Current health, including:  Menstrual cycle.  Method of birth control.  Emotional well-being.  Home life and relationship well-being.  Sexual activity and sexual health.  Lifestyle, including:  Alcohol, nicotine or tobacco, and drug use.  Access to firearms.  Diet, exercise, and sleep habits.  Work and work Astronomer.  Sunscreen use.  Safety issues such as seatbelt and bike helmet use.  Physical exam  Your health care provider will check your:  Height and weight. These may be used to calculate your BMI (body mass index). BMI is a measurement that tells if you are at a healthy weight.  Waist circumference. This measures the distance around your waistline. This measurement also tells if you are at a healthy weight and may help predict your risk of certain diseases, such as type 2 diabetes and high blood pressure.  Heart rate and blood pressure.  Body temperature.  Skin for abnormal spots.  What immunizations do I need?    Vaccines are usually given at various ages, according to a schedule. Your health care provider will recommend vaccines for you based on your age, medical history, and lifestyle or other factors, such as travel or where you work.  What tests do I need?  Screening  Your health care provider may recommend screening tests for certain conditions. This may include:  Lipid and cholesterol levels.  Diabetes screening. This is done by checking your blood sugar (glucose) after you have not eaten for a while (fasting).  Pelvic exam and Pap test.  Hepatitis B test.  Hepatitis C  test.  HIV (human immunodeficiency virus) test.  STI (sexually transmitted infection) testing, if you are at risk.  Lung cancer screening.  Colorectal cancer screening.  Mammogram. Talk with your health care provider about when you should start having regular mammograms. This may depend on whether you have a family history of breast cancer.  BRCA-related cancer screening. This may be done if you have a family history of breast, ovarian, tubal, or peritoneal cancers.  Bone density scan. This is done to screen for osteoporosis.  Talk with your health care provider about your test results, treatment options, and if necessary, the need for more tests.  Follow these instructions at home:  Eating and drinking    Eat a diet that includes fresh fruits and vegetables, whole grains, lean protein, and low-fat dairy products.  Take vitamin and mineral supplements as recommended by your health care provider.  Do not drink alcohol if:  Your health care provider tells you not to drink.  You are pregnant, may be pregnant, or are planning to become pregnant.  If you drink alcohol:  Limit how much you have to 0-1 drink a day.  Know how much alcohol is in your drink. In the [place] one drink equals one 12 oz bottle of beer (355 mL), one 5 oz glass of wine (148 mL), or one 1 oz glass of hard liquor (44 mL).  Lifestyle  Brush your teeth every morning and night with fluoride toothpaste. Floss one time each day.  Exercise for at least  30 minutes 5 or more days each week.  Do not use any products that contain nicotine or tobacco. These products include cigarettes, chewing tobacco, and vaping devices, such as e-cigarettes. If you need help quitting, ask your health care provider.  Do not use drugs.  If you are sexually active, practice safe sex. Use a condom or other form of protection to prevent STIs.  If you do not wish to become pregnant, use a form of birth control. If you plan to become pregnant, see your health care provider for a  prepregnancy visit.  Take aspirin only as told by your health care provider. Make sure that you understand how much to take and what form to take. Work with your health care provider to find out whether it is safe and beneficial for you to take aspirin daily.  Find healthy ways to manage stress, such as:  Meditation, yoga, or listening to music.  Journaling.  Talking to a trusted person.  Spending time with friends and family.  Minimize exposure to UV radiation to reduce your risk of skin cancer.  Safety  Always wear your seat belt while driving or riding in a vehicle.  Do not drive:  If you have been drinking alcohol. Do not ride with someone who has been drinking.  When you are tired or distracted.  While texting.  If you have been using any mind-altering substances or drugs.  Wear a helmet and other protective equipment during sports activities.  If you have firearms in your house, make sure you follow all gun safety procedures.  Seek help if you have been physically or sexually abused.  What's next?  Visit your health care provider once a year for an annual wellness visit.  Ask your health care provider how often you should have your eyes and teeth checked.  Stay up to date on all vaccines.  This information is not intended to replace advice given to you by your health care provider. Make sure you discuss any questions you have with your health care provider.  Document Revised: 09/23/2020 Document Reviewed: 09/23/2020  Elsevier Patient Education  2024 [employer].

## 2023-10-03 NOTE — Progress Notes (Signed)
   Michelle Walter [birth year] [id number]   History: Postmenopausal 63 y.o. presents for annual exam. Requests yearly paps. C/o vaginal dryness, had spot bleeding after intercourse recently. No other gyn concerns. Has a PCP and dermatologist.   Gynecologic History Postmenopausal Last Pap: 09/2022. Results were: normal Last mammogram: 5/25. Results were: normal Last colonoscopy: 09/2022 DEXA:4/24 osteopenia  Obstetric History OB History  Gravida Para Term Preterm AB Living  2 2 2   2   SAB IAB Ectopic Multiple Live Births          # Outcome Date GA Lbr Len/2nd Weight Sex Type Anes PTL Lv  2 Term           1 Term                10/03/2023   10:27 AM  Depression screen PHQ 2/9  Decreased Interest 0  Down, Depressed, Hopeless 0  PHQ - 2 Score 0     The following portions of the patient's history were reviewed and updated as appropriate: allergies, current medications, past family history, past medical history, past social history, past surgical history, and problem list.  Review of Systems Pertinent items noted in HPI and remainder of comprehensive ROS otherwise negative.  Past medical history, past surgical history, family history and social history were all reviewed and documented in the EPIC chart.  Exam:  Vitals:   10/03/23 1026  BP: 126/84  Pulse: 80  SpO2: 97%  Weight: 157 lb 12.8 oz (71.6 kg)  Height: 5' 2 (1.575 m)   Body mass index is 28.86 kg/m.  General appearance:  Normal Thyroid :  Symmetrical, normal in size, without palpable masses or nodularity. Respiratory  Auscultation:  Clear without wheezing or rhonchi Cardiovascular  Auscultation:  Regular rate, without rubs, murmurs or gallops  Edema/varicosities:  Not grossly evident Abdominal  Soft,nontender, without masses, guarding or rebound.  Liver/spleen:  No organomegaly noted  Hernia:  None appreciated  Skin  Inspection:  Grossly normal Breasts: Examined lying and sitting.   Right: Without  masses, retractions, nipple discharge or axillary adenopathy.   Left: Without masses, retractions, nipple discharge or axillary adenopathy. Genitourinary   Inguinal/mons:  Normal without inguinal adenopathy  External genitalia:  Normal appearing vulva with no masses, tenderness, or lesions  BUS/Urethra/Skene's glands:  Normal  Vagina:  Normal appearing with normal color and discharge, no lesions. Atrophy: moderate   Cervix:  Normal appearing without discharge or lesions  Uterus:  Normal in size, shape and contour.  Midline and mobile, nontender  Adnexa/parametria:     Rt: Normal in size, without masses or tenderness.   Lt: Normal in size, without masses or tenderness.  Anus and perineum: Normal    Darice Hoit, CMA present for exam  Assessment/Plan:   1. Well woman exam with routine gynecological exam (Primary) - Cytology - PAP( [place])  2. Depression screening negative  3. Vaginal atrophy - estradiol (ESTRACE VAGINAL) 0.1 MG/GM vaginal cream; Place 1 g vaginally 3 (three) times a week.  Dispense: 42.5 g; Refill: 3  4. Other constipation Begin Life extension Magnesium 500mg  capsule nightly  5. Osteopenia of lumbar spine Calcium and vit D increase weight bearing exercise  DEXA 2026  Discussed SBE, colonoscopy and DEXA screening as directed. Recommend of exercise weekly, including weight bearing exercise. Encouraged the use of seatbelts and sunscreen.  Return in 1 year for annual or sooner prn.  Emersen Mascari B WHNP-BC, 10:37 AM 10/03/2023

## 2023-10-04 LAB — CYTOLOGY - PAP: Diagnosis: NEGATIVE

## 2023-10-05 ENCOUNTER — Ambulatory Visit: Payer: Self-pay | Admitting: Radiology

## 2023-10-18 DIAGNOSIS — M25511 Pain in right shoulder: Secondary | ICD-10-CM | POA: Diagnosis not present

## 2023-10-23 DIAGNOSIS — M25511 Pain in right shoulder: Secondary | ICD-10-CM | POA: Diagnosis not present

## 2023-11-01 DIAGNOSIS — Z85828 Personal history of other malignant neoplasm of skin: Secondary | ICD-10-CM | POA: Diagnosis not present

## 2023-11-01 DIAGNOSIS — M25511 Pain in right shoulder: Secondary | ICD-10-CM | POA: Diagnosis not present

## 2023-11-01 DIAGNOSIS — L2989 Other pruritus: Secondary | ICD-10-CM | POA: Diagnosis not present

## 2023-11-01 DIAGNOSIS — L2489 Irritant contact dermatitis due to other agents: Secondary | ICD-10-CM | POA: Diagnosis not present

## 2023-11-22 DIAGNOSIS — M25511 Pain in right shoulder: Secondary | ICD-10-CM | POA: Diagnosis not present

## 2023-11-30 DIAGNOSIS — Z96611 Presence of right artificial shoulder joint: Secondary | ICD-10-CM | POA: Diagnosis not present

## 2024-01-04 DIAGNOSIS — Z23 Encounter for immunization: Secondary | ICD-10-CM | POA: Diagnosis not present

## 2024-01-19 DIAGNOSIS — M25531 Pain in right wrist: Secondary | ICD-10-CM | POA: Diagnosis not present

## 2024-02-06 ENCOUNTER — Other Ambulatory Visit: Payer: Self-pay | Admitting: Family Medicine

## 2024-02-06 DIAGNOSIS — E041 Nontoxic single thyroid nodule: Secondary | ICD-10-CM

## 2024-02-08 ENCOUNTER — Inpatient Hospital Stay: Admission: RE | Admit: 2024-02-08 | Discharge: 2024-02-08 | Attending: Family Medicine | Admitting: Family Medicine

## 2024-02-08 DIAGNOSIS — E041 Nontoxic single thyroid nodule: Secondary | ICD-10-CM

## 2024-02-08 DIAGNOSIS — E042 Nontoxic multinodular goiter: Secondary | ICD-10-CM | POA: Diagnosis not present

## 2024-02-29 DIAGNOSIS — I10 Essential (primary) hypertension: Secondary | ICD-10-CM | POA: Diagnosis not present

## 2024-02-29 DIAGNOSIS — E785 Hyperlipidemia, unspecified: Secondary | ICD-10-CM | POA: Diagnosis not present

## 2024-02-29 DIAGNOSIS — E041 Nontoxic single thyroid nodule: Secondary | ICD-10-CM | POA: Diagnosis not present

## 2024-02-29 DIAGNOSIS — F411 Generalized anxiety disorder: Secondary | ICD-10-CM | POA: Diagnosis not present

## 2024-02-29 DIAGNOSIS — E559 Vitamin D deficiency, unspecified: Secondary | ICD-10-CM | POA: Diagnosis not present

## 2024-02-29 DIAGNOSIS — R7303 Prediabetes: Secondary | ICD-10-CM | POA: Diagnosis not present

## 2024-02-29 DIAGNOSIS — Z79899 Other long term (current) drug therapy: Secondary | ICD-10-CM | POA: Diagnosis not present

## 2024-02-29 DIAGNOSIS — K5909 Other constipation: Secondary | ICD-10-CM | POA: Diagnosis not present

## 2024-02-29 DIAGNOSIS — K219 Gastro-esophageal reflux disease without esophagitis: Secondary | ICD-10-CM | POA: Diagnosis not present

## 2024-02-29 DIAGNOSIS — Z0000 Encounter for general adult medical examination without abnormal findings: Secondary | ICD-10-CM | POA: Diagnosis not present

## 2024-02-29 DIAGNOSIS — M8588 Other specified disorders of bone density and structure, other site: Secondary | ICD-10-CM | POA: Diagnosis not present

## 2024-02-29 DIAGNOSIS — Z23 Encounter for immunization: Secondary | ICD-10-CM | POA: Diagnosis not present

## 2024-03-06 DIAGNOSIS — Z96611 Presence of right artificial shoulder joint: Secondary | ICD-10-CM | POA: Diagnosis not present

## 2024-03-11 ENCOUNTER — Other Ambulatory Visit (HOSPITAL_COMMUNITY): Payer: Self-pay | Admitting: Orthopedic Surgery

## 2024-03-11 ENCOUNTER — Other Ambulatory Visit: Payer: Self-pay | Admitting: Orthopedic Surgery

## 2024-03-11 DIAGNOSIS — Z96611 Presence of right artificial shoulder joint: Secondary | ICD-10-CM

## 2024-03-19 ENCOUNTER — Encounter: Payer: Self-pay | Admitting: Orthopedic Surgery

## 2024-03-21 ENCOUNTER — Inpatient Hospital Stay
Admission: RE | Admit: 2024-03-21 | Discharge: 2024-03-21 | Attending: Orthopedic Surgery | Admitting: Orthopedic Surgery

## 2024-03-21 DIAGNOSIS — M25511 Pain in right shoulder: Secondary | ICD-10-CM | POA: Diagnosis not present

## 2024-03-21 DIAGNOSIS — Z471 Aftercare following joint replacement surgery: Secondary | ICD-10-CM | POA: Diagnosis not present

## 2024-03-21 DIAGNOSIS — Z96611 Presence of right artificial shoulder joint: Secondary | ICD-10-CM | POA: Diagnosis not present

## 2024-04-02 ENCOUNTER — Encounter (HOSPITAL_COMMUNITY)
Admission: RE | Admit: 2024-04-02 | Discharge: 2024-04-02 | Disposition: A | Discharge status: Home / Self Care | Source: Ambulatory Visit | Attending: Orthopedic Surgery | Admitting: Orthopedic Surgery

## 2024-04-02 ENCOUNTER — Ambulatory Visit (HOSPITAL_COMMUNITY)
Admission: RE | Admit: 2024-04-02 | Discharge: 2024-04-02 | Disposition: A | Discharge status: Home / Self Care | Source: Ambulatory Visit | Attending: Orthopedic Surgery | Admitting: Orthopedic Surgery

## 2024-04-02 DIAGNOSIS — Z96611 Presence of right artificial shoulder joint: Secondary | ICD-10-CM | POA: Diagnosis present

## 2024-04-02 MED ORDER — TECHNETIUM TC 99M MEDRONATE IV KIT
20.0000 | PACK | Freq: Once | INTRAVENOUS | Status: AC | PRN
  Administered 2024-04-02: 20 via INTRAVENOUS

## 2024-10-03 ENCOUNTER — Encounter: Admitting: Radiology
